# Patient Record
Sex: Male | Born: 1985 | Race: White | Hispanic: No | Marital: Single | Smoking: Current every day smoker
Health system: Southern US, Community
[De-identification: ages and names within clinical notes are randomized; demographics above are authoritative.]

## PROBLEM LIST (undated history)

## (undated) ENCOUNTER — Inpatient Hospital Stay (INDEPENDENT_AMBULATORY_CARE_PROVIDER_SITE_OTHER): Payer: Self-pay | Admitting: Surgery

## (undated) DIAGNOSIS — F1990 Other psychoactive substance use, unspecified, uncomplicated: Secondary | ICD-10-CM

## (undated) DIAGNOSIS — Z789 Other specified health status: Secondary | ICD-10-CM

## (undated) HISTORY — PX: NO PAST SURGERIES: SHX2092

---
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
## 2004-02-21 ENCOUNTER — Emergency Department (HOSPITAL_COMMUNITY): Admission: EM | Admit: 2004-02-21 | Discharge: 2004-02-21 | Payer: Self-pay | Admitting: Emergency Medicine

## 2004-03-04 ENCOUNTER — Inpatient Hospital Stay (HOSPITAL_COMMUNITY): Admission: EM | Admit: 2004-03-04 | Discharge: 2004-03-07 | Payer: Self-pay | Admitting: Emergency Medicine

## 2004-08-13 ENCOUNTER — Emergency Department (HOSPITAL_COMMUNITY): Admission: EM | Admit: 2004-08-13 | Discharge: 2004-08-13 | Payer: Self-pay | Admitting: Emergency Medicine

## 2004-09-09 ENCOUNTER — Emergency Department (HOSPITAL_COMMUNITY): Admission: EM | Admit: 2004-09-09 | Discharge: 2004-09-09 | Payer: Self-pay | Admitting: Emergency Medicine

## 2005-03-01 ENCOUNTER — Emergency Department (HOSPITAL_COMMUNITY): Admission: EM | Admit: 2005-03-01 | Discharge: 2005-03-01 | Payer: Self-pay | Admitting: Emergency Medicine

## 2006-06-01 ENCOUNTER — Emergency Department (HOSPITAL_COMMUNITY): Admission: EM | Admit: 2006-06-01 | Discharge: 2006-06-01 | Payer: Self-pay | Admitting: Emergency Medicine

## 2006-06-11 ENCOUNTER — Ambulatory Visit (HOSPITAL_BASED_OUTPATIENT_CLINIC_OR_DEPARTMENT_OTHER): Admission: RE | Admit: 2006-06-11 | Discharge: 2006-06-11 | Payer: Self-pay | Admitting: Otolaryngology

## 2006-12-09 ENCOUNTER — Emergency Department (HOSPITAL_COMMUNITY): Admission: EM | Admit: 2006-12-09 | Discharge: 2006-12-09 | Payer: Self-pay | Admitting: Emergency Medicine
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
## 2008-06-01 ENCOUNTER — Emergency Department (HOSPITAL_COMMUNITY): Admission: EM | Admit: 2008-06-01 | Discharge: 2008-06-01 | Payer: Self-pay | Admitting: Emergency Medicine

## 2008-12-22 ENCOUNTER — Emergency Department (HOSPITAL_COMMUNITY): Admission: EM | Admit: 2008-12-22 | Discharge: 2008-12-22 | Payer: Self-pay | Admitting: Emergency Medicine

## 2009-09-22 ENCOUNTER — Emergency Department (HOSPITAL_BASED_OUTPATIENT_CLINIC_OR_DEPARTMENT_OTHER): Admission: EM | Admit: 2009-09-22 | Discharge: 2009-09-22 | Payer: Self-pay | Admitting: Emergency Medicine

## 2009-11-20 ENCOUNTER — Emergency Department (HOSPITAL_COMMUNITY): Admission: EM | Admit: 2009-11-20 | Discharge: 2009-11-20 | Payer: Self-pay | Admitting: Emergency Medicine

## 2009-12-26 ENCOUNTER — Emergency Department (HOSPITAL_COMMUNITY): Admission: EM | Admit: 2009-12-26 | Discharge: 2009-12-26 | Payer: Self-pay | Admitting: Emergency Medicine

## 2010-01-05 ENCOUNTER — Emergency Department (HOSPITAL_COMMUNITY): Admission: EM | Admit: 2010-01-05 | Discharge: 2010-01-06 | Payer: Self-pay | Admitting: Emergency Medicine

## 2010-01-08 ENCOUNTER — Emergency Department (HOSPITAL_COMMUNITY): Admission: EM | Admit: 2010-01-08 | Discharge: 2010-01-08 | Payer: Self-pay | Admitting: Emergency Medicine

## 2010-05-02 LAB — URINALYSIS, ROUTINE W REFLEX MICROSCOPIC
Glucose, UA: NEGATIVE mg/dL
Hgb urine dipstick: NEGATIVE
Ketones, ur: NEGATIVE mg/dL
Nitrite: NEGATIVE
Protein, ur: NEGATIVE mg/dL
Specific Gravity, Urine: 1.026 (ref 1.005–1.030)
pH: 6 (ref 5.0–8.0)

## 2010-05-02 LAB — DIFFERENTIAL
Basophils Absolute: 0.1 10*3/uL (ref 0.0–0.1)
Basophils Relative: 1 % (ref 0–1)
Lymphocytes Relative: 27 % (ref 12–46)
Monocytes Absolute: 0.4 10*3/uL (ref 0.1–1.0)
Monocytes Relative: 5 % (ref 3–12)
Neutrophils Relative %: 64 % (ref 43–77)

## 2010-05-02 LAB — BASIC METABOLIC PANEL
BUN: 12 mg/dL (ref 6–23)
CO2: 27 mEq/L (ref 19–32)
Chloride: 107 mEq/L (ref 96–112)
GFR calc Af Amer: >60 mL/min (ref >60)
GFR calc non Af Amer: >60 mL/min (ref >60)
Potassium: 3.9 mEq/L (ref 3.5–5.1)

## 2010-05-02 LAB — POCT TOXICOLOGY PANEL
Cocaine: POSITIVE
Tetrahydrocannabinol: POSITIVE

## 2010-05-02 LAB — CBC
MCV: 99.6 fL (ref 78.0–100.0)
RBC: 4.82 MIL/uL (ref 4.22–5.81)

## 2010-05-02 LAB — URINE MICROSCOPIC-ADD ON

## 2010-05-02 LAB — ETHANOL: Alcohol, Ethyl (B): <5 mg/dL (ref 0–10)

## 2010-07-04 NOTE — Group Therapy Note (Signed)
NAMEKAIDYN, JAVID              ACCOUNT NO.:  [id number]   MEDICAL RECORD NO.:  [id number]          PATIENT TYPE:  INP   LOCATION:  IC10                          FACILITY:  [facility]   PHYSICIAN:  Edward L. Juanetta Gosling, M.D.DATE OF BIRTH:  [birth year]   DATE OF PROCEDURE:  03/05/2004  DATE OF DISCHARGE:                                   PROGRESS NOTE   SUBJECTIVE:  Mr. Luberto seems to be doing better this morning.  He is a  little more alert.  He is on Diprivan still.  His white count this morning  is 21,000, which is down from 25,000 yesterday.  His chest x-ray actually  looks a bit better to me.  He still has bilateral infiltrates, but it does  look better.  BMET shows normal.  Blood gas shows on a rate of 8:  PO2 is  185, pCO2 41, pH 7.43.   ASSESSMENT:  I think since his infiltrate looks to me a bit better, his  white count is down some.  I do believe he has probably aspirated but  overall, things seem a little bit better, so I think we should go ahead and  work on weaning today.     Edwa   ELH/MEDQ  D:  03/05/2004  T:  03/05/2004  Job:  [id number]

## 2010-07-04 NOTE — Group Therapy Note (Signed)
NAMEBUFFORD, HELMS              ACCOUNT NO.:  [id number]   MEDICAL RECORD NO.:  [id number]          PATIENT TYPE:  INP   LOCATION:  IC10                          FACILITY:  [facility]   PHYSICIAN:  Edward L. Juanetta Gosling, M.D.DATE OF BIRTH:  05/08/1985   DATE OF PROCEDURE:  DATE OF DISCHARGE:                                   PROGRESS NOTE   This is a patient of Dr. Blair Dolphin in the intensive care unit.   PROBLEM:  Status post drug overdose with respiratory failure from that and  probably aspiration pneumonia.   SUBJECTIVE:  Mr. Russell Chan is doing much better.  He has no new complaints.  His exam shows that he is awake and alert.  Blood gas shows pH of 7.41, pCO2  of 37, pO2 of 81.  This on 2 L.  His CBC shows white count 17,300,  hemoglobin is 12.9.  BMET shows sodium 133.  The rest of his electrolytes  are normal.   ASSESSMENT:  He seems to be doing well.  He was extubated yesterday.  He has  tolerated that very well.   PLAN:  Plan is that I am going to plan to sign off at this point.  Thank you  for allowing me to see him with you.     Edwa   ELH/MEDQ  D:  03/06/2004  T:  03/06/2004  Job:  [id number]

## 2010-07-04 NOTE — Group Therapy Note (Signed)
Russell Chan, Russell Chan              ACCOUNT NO.:  [id number]   MEDICAL RECORD NO.:  [id number]          PATIENT TYPE:  INP   LOCATION:  A207                          FACILITY:  [facility]   PHYSICIAN:  Vania Rea, M.D. DATE OF BIRTH:  19-Apr-1985   DATE OF PROCEDURE:  DATE OF DISCHARGE:                                   PROGRESS NOTE   PROGRESS NOTE:  Hospital day number three.   SUBJECTIVE:  Patient is alert and oriented.  expresses thanks that his life  has been saved. He denies any suicidal ideation; however, in consulting we  learned patient was recently committed to [facility] earlier this month  because of suicide attempts.  This was his third overdose and apparent  suicide attempt.  Apparently in the habit of drugging.   OBJECTIVE:  VITAL SIGNS:  Temperature 97, pulse 101, respirations 20, blood  pressure 111/63. He is saturating at 91% on room air.  CHEST:  He has diffuse rhonchi and wheezes.  CARDIOVASCULAR:  Regular rate and rhythm.  ABDOMEN:  Soft and nontender.  EXTREMITIES:  Without edema.   LABORATORY DATA:  His ABG indicates continued hypoxia for his age on two  liters.  His pO2 is only 81.  His white count has fallen to 17,000.  His  serum chemistry is unremarkable.   ASSESSMENT AND PLAN:  1.  Polysubstance abuse, drug overdose, history of recurrent overdoses.  We      will continue him on suicide precautions.  2.  Aspiration pneumonia, resolving.  bronchitis; We will continue Levaquin      and clindamycin; start nebs. continue oxygen.  3.  Tobacco abuse.  We will continue the nicotine patch.   The patient will not be eligible for transfer to a psych facility until he  is taken off oxygen, so he may be here for a few more days.  When his oxygen  requirements return to normal, patient can probably be discharged to a psych  facility on oral medications.     Leop   LC/MEDQ  D:  03/06/2004  T:  03/06/2004  Job:  [id number]
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
## 2010-07-04 NOTE — Procedures (Signed)
NAMELENARDO, WESTWOOD              ACCOUNT NO.:  [id number]   MEDICAL RECORD NO.:  [id number]          PATIENT TYPE:  INP   LOCATION:  IC10                          FACILITY:  [facility]   PHYSICIAN:  Dani Gobble, MD       DATE OF BIRTH:  05-08-1985   DATE OF PROCEDURE:  03/05/2004  DATE OF DISCHARGE:                                  ECHOCARDIOGRAM   REFERRING PHYSICIAN:  Dr. Orvan Falconer   INDICATIONS:  Mr. Perotti is a 25 year old who was admitted with an  overdose, referred for evaluation of LV function.   The technical quality of the study is adequate.   The aorta is within normal limits at 3.4 cm.   The left atrium is also within normal limits at 3.4 cm.  No obvious clots or  masses were appreciated and the patient appeared to be in mild sinus  tachycardia during the procedure.   The intraventricular septum and posterior wall are within normal limits in  thickness at approximately 1.0 cm.   The aortic valve appeared reasonably structurally normal with normal leaflet  excursion.  No significant aortic insufficiency is noted.  Doppler  interrogation of the aortic valve is within normal limits.   The mitral valve also appeared structurally normal.  Trivial and probably  physiologic mitral regurgitation is noted.  Doppler interrogation of the  mitral valve is within normal limits.   The pulmonic valve is incompletely visualized.   The tricuspid valve also appears structurally normal with mild tricuspid  regurgitation noted.   The left ventricle is normal in size with the LVIDD measured at 4.9 cm and  the LVISD measured at 3.8 cm.  Overall left ventricular systolic function  appears to be low normal to borderline mildly depressed.  Estimated ejection  fraction is approximately 50%.  At times there appears to be mild global  hypokinesis and at other times there appears to be some regionality to it  with anterior septal wall hypokinesis slightly more so than the remaining   walls.   The right ventricle appears to be at the upper limits of normal in size  with, again, low normal right ventricular systolic function to mildly  depressed.   IMPRESSION:  1.  Trivial and probably physiologic mitral regurgitation.  2.  Mild tricuspid regurgitation.  3.  Normal left ventricular chamber size with low normal ejection fraction      to borderline mildly depressed left ventricular ejection fraction      estimated at 50%.  At times there appears to be mild global hypokinesis      and at other times there appears to be some regionality to this with      anterior septal wall being somewhat more hypokinetic than the remaining      walls.  4.  Low normal right ventricular systolic function to mildly depressed right      ventricular systolic function.  5.  Consider repeat echocardiogram in three months.      AB/MEDQ  D:  03/05/2004  T:  03/05/2004  Job:  [id number]

## 2010-07-04 NOTE — Group Therapy Note (Signed)
[name], [name]              ACCOUNT NO.:  [id number]   MEDICAL RECORD NO.:  [id number]          PATIENT TYPE:  INP   LOCATION:  IC10                          FACILITY:  [facility]   PHYSICIAN:  Vania Rea, M.D. DATE OF BIRTH:  1986/02/06   DATE OF PROCEDURE:  DATE OF DISCHARGE:                                   PROGRESS NOTE   Hospital day number two.   SUBJECTIVE:  The patient said he is having no problem.  He became very  agitated last night on the ventilator and had to be placed on Diprivan and  his Ativan weaned off.  Because of his agitation last night, he was advanced  to weaning protocol this morning and did very well and came off the vent at  about midday.  Since then he has been talking.  He has been started on a  clear liquid diet.  He has no memory of what happened yesterday.   OBJECTIVE:  VITAL SIGNS:  His blood pressure is 121/60.  His temperature had  a T max of 103.  Pulses are 106 now that he is off the vent.  His  respirations are 27.  CHEST:  Actually clear to auscultation bilaterally.  CARDIOVASCULAR:  Tachycardic.  ABDOMEN:  Soft and nontender.  EXTREMITIES:  Without edema.  He does not appear to be dehydrated.   LABORATORY:  His white count has decreased to 21.5.  It is otherwise  unremarkable.  His serum chemistry is entirely normal.  His total CK drawn  yesterday evening is 1539, but his CK-MB was only 6.5.  His liver function  tests were normal, although his albumin was somewhat low for his age at 2.9.  His calcium was 8.3.  Blood cultures are so far negative.  His beta type  natriuretic peptide was only 82.  His ABG done when he went on FiO2 of 40%  on pressure support of 10 showed a pH of 7.4, pCO2 of 4, a pO2 of 83,  saturating at 96.7%.  Currently saturating at 95% on 40% mask.  His chest x-  ray shows some improvement in the opacities.   ASSESSMENT:  1.  Altered mental status, probably related to drug overdose, improved. will      continue  supportive care, O2 supplementation and antibiotics;  2.  Hypoxic respiratory failure, improving.  3.  Probable aspiration pneumonia.  We will continue Levaquin and      clindamycin.  Tracheal and blood cultures are pending.  4.  Polysubstance abuse.  We will give a nicotine patch.  When this patient      is clinically more stable, probably tomorrow, we will get an ACT consult      for consideration for transfer to a psychiatric facility.  We will      continue one to one nursing.     Leop  LC/MEDQ  D:  03/05/2004  T:  03/05/2004  Job:  [id number]

## 2010-07-04 NOTE — Consult Note (Signed)
NAMESUEDE, GREENAWALT              ACCOUNT NO.:  [id number]   MEDICAL RECORD NO.:  [id number]          PATIENT TYPE:  INP   LOCATION:  IC10                          FACILITY:  [facility]   PHYSICIAN:  Edward L. Juanetta Gosling, M.D.DATE OF BIRTH:  [birth year]   DATE OF CONSULTATION:  DATE OF DISCHARGE:                                   CONSULTATION   CONSULTING PHYSICIAN:  Edward L. Juanetta Gosling, M.D.   REFERRING PHYSICIAN:  Vania Rea, M.D.   REASON FOR CONSULTATION:  Respiratory failure, status post a drug overdose.   HISTORY:  Mr. Baetz is a 25 year old who had apparently fallen asleep  about 7 yesterday evening and when his friends went to wake him up, he was  not arousable.  He had some sort of yellowish fluid from his nostrils.  Called EMS who came and saw him.  He was unconscious.  He had a Glasgow Coma  Score of 3.  Pulse ox was 94%.  Blood glucose 207.  He was intubated there.  He did receive Narcan.  He was intubated, became aroused, extubated himself.  He was re intubated.  His girlfriend apparently has to take methadone and  noted that some of her methadone disks were missing, and she thinks that he  may have taken them but is not clear.  He has been in the intensive care  unit since then.  He has had a number of tests done.   PAST MEDICAL HISTORY:  Pretty much unknown, except he apparently has some  allergies and takes Claritin occasionally.   According to his girlfriend, he smokes a package of cigarettes daily.  He  drinks 6-12 beers every other day.  He does use marijuana and not known if  he is on any other medications or not.   FAMILY HISTORY:  Unknown, except that apparently his mother is in a halfway  house.   PHYSICAL EXAMINATION:  GENERAL:  Shows that he is intubated, sedated,  unresponsive.  VITAL SIGNS:  His pulse is 140, blood pressure 98/64, O2 sat is 99% on  current settings.  HEENT:  Shows his pupils are small.  I am not sure if they are reacting or  not.   Mucous membranes are moist.  Nose and throat are clear.  CHEST:  Fairly clear.  HEART:  Rapid without gallop.  ABDOMEN:  Soft.  No masses are felt.  EXTREMITIES:  Showed no edema.  RECTAL:  He had some sort of lesion on his buttocks.   Arterial blood gas, on 100% assist control of 12, 550 tidal volume, shows a  pH of 7.13, pCO2 82, pO2 of 259, O2 sat 99%.  On 50% pH is 7.35, pCO2 of 50,  pO2 of 44.  White count is 25,000, hemoglobin 15.7, platelets 337, 90%  neutrophils, increased bands.  Sodium is 133, calcium 8.4. Urine drug screen  is positive for benzodiazepines, THC.  Acetaminophen and salicylate levels  are undetectable.  Alcohol undetectable.   Chest x-ray shows bilateral infiltrates.   ASSESSMENT:  1.  He has probably had an acute drug overdose.  2.  He has  bilateral infiltrates suggestive of acute respiratory distress      syndrome versus aspiration which of course is another possibility.  3.  Based on his exam, he appears to be dehydrated.  4.  He was down for an unknown period of time, so he may well have some      hypoxic brain damage as well.  I do not think we have a good answer at      this point.   He has blood cultures pending.  Last blood gas looks better on 70% O2.  Because of the elevated white blood cell count, the possibility that he has  a aspiration, I think we should go ahead and treat him with an antibiotic.  Dr. Orvan Falconer has already ordered clindamycin which is perfectly appropriate.  We will recheck labs, etcetera in the morning.  I do not think there is a  great deal else to add.     Edwa   ELH/MEDQ  D:  03/04/2004  T:  03/04/2004  Job:  [id number]

## 2010-07-04 NOTE — Discharge Summary (Signed)
Russell Chan, Russell Chan              ACCOUNT NO.:  [id number]   MEDICAL RECORD NO.:  [id number]          PATIENT TYPE:  INP   LOCATION:  A207                          FACILITY:  [facility]   PHYSICIAN:  Vania Rea, M.D. DATE OF BIRTH:  02-Dec-1985   DATE OF ADMISSION:  03/04/2004  DATE OF DISCHARGE:  01/19/2006LH                                 DISCHARGE SUMMARY   PRIMARY CARE PHYSICIAN:  Unassigned.   DISCHARGE DIAGNOSES:  1.  Altered mental status and coma due to drug overdose.  2.  Acute respiratory failure, resolved.  3.  Aspiration pneumonia, much improved.  4.  Bronchitis, much improved.  5.  Tobacco abuse, ongoing.  6.  A history of alcohol abuse.   DISPOSITION:  Transfer to [facility].   DISCHARGE CONDITION:  Stable.   DISCHARGE MEDICATIONS:  1.  Combivent inhaler 2 puffs 4 times daily.  2.  Clindamycin 300 mg 3 times daily.  3.  Folic acid 1 mg daily.  4.  Levaquin 750 mg daily for 5 days.  5.  Thiamine 100 mg daily.   HOSPITAL COURSE:  Please refer to the admission history and physical of  January 17th.  This is a 25 year old Caucasian man who was brought in  unconscious with a history of being found unresponsive on his girlfriend's  couch at about 8:30 or 9:00 in the morning.  The girlfriend gave the history  that he had fallen asleep on the couch from 7:00 the night before.  She also  noted that about 25 of her methadone 40-mg disks were missing.  The patient  was given Narcan by the EMS and became combative, extubated himself in the  ambulance, came to the emergency room and was reintubated but was found to  be severely hypoxic with bilateral lung infiltrates.  The patient was also  febrile and had a white count of 25,000.  The patient was kept on the  ventilator overnight, given clindamycin and Levaquin in case of aspiration  and had the benefit of a pulmonary consult.  She did well, and we will able  to extubate the following day.  He,  however, remains severely hypoxic, and  his white count is 17,000 up until yesterday.  His breathing, however, has  progressively improved, and today he is saturating well on room air.  His  white count has fallen closer to the normal range, and he is agitated to be  discharged.  Despite the fact that this is his third overdose in a month,  and he was recently admitted to [facility] in early January, he insists  that he is not suicidal.  However, we consider this young man somewhat  irresponsible and is definitely at risk of taking his own life, and we  strongly recommend committal.  This morning he is alert and oriented.  His  only complaint is that he would like his nicotine patch discontinued so that  he can smoke.   OBJECTIVE:  VITAL SIGNS:  Temperature 98, pulse 93, respirations 18, blood  pressure 99/54.  He is saturating at 91% on room air.  CHEST:  Clear to auscultation bilaterally.  CARDIOVASCULAR SYSTEM:  Regular rhythm.  ABDOMEN:  Soft and nontender.  EXTREMITIES:  Without edema.   LABORATORY:  His white count is 11.9.  His hemoglobin is 13.9, platelets  210.  His sodium is 134, potassium 3.4, chloride 100, CO2 25, glucose 88,  BUN 7, creatinine 0.8, calcium 8.7.  His urine drug screen was positive for  THC  and benzodiazepine.  Negative for cocaine and opiates.   FOLLOWUP:  The patient is to be followed up by the attending physician at  [facility].     Leop   LC/MEDQ  D:  03/07/2004  T:  03/07/2004  Job:  [id number]

## 2010-07-04 NOTE — H&P (Signed)
Russell Chan, Russell Chan              ACCOUNT NO.:  [id number]   MEDICAL RECORD NO.:  [id number]          PATIENT TYPE:  EMS   LOCATION:  ED                            FACILITY:  [facility]   PHYSICIAN:  Vania Rea, M.D. DATE OF BIRTH:  1986-02-08   DATE OF ADMISSION:  03/04/2004  DATE OF DISCHARGE:  LH                                HISTORY & PHYSICAL   PRIMARY CARE PHYSICIAN:  Unassigned.   CHIEF COMPLAINT:  Found unconscious, a possible drug overdose.   HISTORY OF PRESENT ILLNESS:  This is a 25 year old Caucasian man, brought in  by EMS.  The history is from the emergency room physician and the patient's  girlfriend.  The patient apparently was left asleep on his girlfriend's  couch from 7 p.m. yesterday evening.  She tried to arouse him at about 8:30  or 9 a.m. this morning, and he was not arousable.  Then she noticed yellow  fluid draining from his nostrils.  She called the [facility], who from their records and the emergency room physician's report  found him to be unconscious with a Glasgow coma scale of 3.  His pulse  oximetry was 94%. (apparently on 15l O2)  His blood glucose was 207.  He was  intubated.  He received naloxone 2 mg.   His blood pressure was 154/palpable.  His heart rate was 132.  His  respirations were 8.  His Glasgow coma score was 3.  He was completely  unconscious and his pupils were dilated.  Apparently he became arousable in  response to the naloxone, because he extubated himself, but in the emergency  room he was re-intubated.  His girlfriend notes that 98 of her methadone  40mg  discs were missing, and she feels that he may have taken them, but she  is not sure.  In the emergency room the patient was intubated, but because  of agitation, had to be sedated and received 5 mg of Ativan.  He did not  receive any further naloxone, but did continue to vomit after intubation,  and an orogastric tube was passed.   Initial ABG after intubation  revealed that the patient was hypoxic with a PF  ratio of 250, and severely hypercarbic with a pCO2 of 80.  After adjustments  were made on  the vent, his saturation dropped significantly.  His O2 has  been increased.  He has had chest x-rays, urine drug screens and other blood  work.   His girlfriend knows of no medical illnesses she does note that he  occasionally takes Claritin for nasal allergies.   PAST MEDICAL HISTORY:  Unable to ascertain.   MEDICATIONS:  Unable to ascertain, but Claritin occasionally.   ALLERGIES:  No known drug allergies.   SOCIAL HISTORY:  According to his girlfriend, he smokes one pack of  cigarettes per day. He drinks six to 12 beers every other day.  He smokes  marijuana.  She is unsure of any other illicit drug use.  He has been  drinking alcohol at this level for about two to three years now.  His mother  is apparently in a half-way house.   FAMILY HISTORY:  We were unable to obtain.   REVIEW OF SYSTEMS:  Unable to obtain.  His girlfriend says he had no  complaints that she knows of.   PHYSICAL EXAMINATION:  GENERAL:  A young Caucasian man, lying on the  stretcher, intubated and completely unresponsive.  VITAL SIGNS:  The vent settings currently are FIO2 of 7%, rate of 16, volume  550.  His vital signs of this setting reveal a pulse of 114, respirations  29, blood pressure 110/65, saturation 96%.  HEENT:  His pupils are about 2 mm, equal, with no obvious reaction to light.  His mucous membranes are moist.  CHEST:  Clear to auscultation bilaterally.  CARDIOVASCULAR:  Tachycardic, no murmurs.  ABDOMEN:  Scaphoid, no masses.  EXTREMITIES:  Without edema.  His feet are warm.   LABORATORY DATA:  His initial ABG on 100% with an AC of 12 x550:  A pH of  7.13, pCO2 of 82, pO2 of 259, saturation 99%.  His last ABG on 50% with an  AC of 16 x550:  A pH of 7.35, pCO2 of 50.8, pO2 of 44, saturation 81%.  CBC:  His white count is 25.7, hemoglobin 15.7,  platelets 337.  He has 90%  neutrophils and he has increased bands on a smear.  Sodium 133, potassium  5.2, chloride 94, CO2 of 25, glucose 132, BUN 8, creatinine 1.3, calcium  8.4.  Acetaminophen and salicylate levels are undetectable.  His urine drug  screen in positive for benzodiazepines and THC.  No cocaine or opiates  detected.  Of course, methadone does not show up as opiates in the urine.  His alcohol level was undetectable.  The computer does record that he was  here on February 22, 2004, of an alcohol level of 115.  His urinalysis shows  him to have a specific gravity greater than 1030, protein is 100, white  count is 11-20, red cells 7-10, a few bacteria and amorphous phosphate.   His chest x-ray shows adequate placement of his ET tube, but also bilateral  infiltrates.   ASSESSMENT:  1.  Probable acute unconsciousness due to acute drug overdose.  2.  Adult respiratory distress syndrome by chest x-ray and blood work;      likely due to aspiration or drug induced.  3.  Acute systemic inflammatory response syndrome by white count and      tachycardia.  4.  Dehydration by clinical exam, chemistry and urinalysis.   PLAN:  We will hydrate him.  We will adjust the vent settings to get his CO2  closer to 40, and his pO2 closer to 100.  Will monitor his  electrocardiogram.  Will give Narcan p.r.n.  We will reassess him for  weaning in the morning. Will cover with abx for aspiration; other management  as per orders.     Leop   LC/MEDQ  D:  03/04/2004  T:  03/04/2004  Job:  [id number]

## 2010-08-31 ENCOUNTER — Encounter (INDEPENDENT_AMBULATORY_CARE_PROVIDER_SITE_OTHER): Payer: Self-pay | Admitting: Surgery

## 2010-08-31 NOTE — Discharge Summary (Signed)
Per the 1991 NIH Consensus Statement, the patient is a candidate for bariatric surgery.  The patient attended our initial information session and reviewed the types of bariatric surgery.  The patient is interested in the Roux en Y Gastric Bypass.  I discussed with the patient the indications and risks of bariatric surgery.  The potential risks of surgery include, but are not limited to, bleeding, infection, leak from the bowel, DVT and PE, long term nutrition consequences, and death.   Not sure why this is showing up in December 2014 in "My Incomplete Notes" file.  Strange Epic presentation.

## 2010-08-31 NOTE — Discharge Summary (Signed)
3 month followup with me.  Labs to be obtained by your medical doctor about 1 week prior to your next visit with me:  CDC, CMET, Lipid panel, HGBA1C, Folate, Fe, IBC, B12, Thiamine, Mg++

## 2010-08-31 NOTE — H&P (Signed)
Russell Chan is a 25 y.o. white male who is a patient of No primary provider on file. and comes to me today for bariatric surgery.  No past medical history on file. No past surgical history on file. Current Outpatient Prescriptions  Medication Sig Dispense Refill  . cephALEXin (KEFLEX) 250 MG capsule Take 250 mg by mouth 4 (four) times daily.        . famotidine (PEPCID) 20 MG tablet Take 20 mg by mouth 2 (two) times daily.        . ranitidine (ZANTAC) 150 MG tablet Take 150 mg by mouth 2 (two) times daily.         Allergies not on file  SOCIAL HISTORY:  PHYSICAL EXAM:  ASSESSMENT:  PLAN:   He needs emergency bariatric surgery.  Per the 1991 NIH Consensus Statement, the patient is a candidate for bariatric surgery.  The patient attended our initial information session and reviewed the types of bariatric surgery.  The patient is interested in the Roux en Y Gastric Bypass.  I discussed with the patient the indications and risks of bariatric surgery.  The potential risks of surgery include, but are not limited to, bleeding, infection, leak from the bowel, DVT and PE, long term nutrition consequences, and death.  The patient understands the importance of compliance and long term follow-up with our group after surgery.  Candis Shine .dhn

## 2010-12-19 ENCOUNTER — Inpatient Hospital Stay (INDEPENDENT_AMBULATORY_CARE_PROVIDER_SITE_OTHER)
Admission: RE | Admit: 2010-12-19 | Discharge: 2010-12-19 | Disposition: A | Discharge status: Home / Self Care | Payer: Self-pay | Source: Ambulatory Visit | Attending: Emergency Medicine | Admitting: Emergency Medicine

## 2010-12-19 DIAGNOSIS — K047 Periapical abscess without sinus: Secondary | ICD-10-CM

## 2010-12-23 ENCOUNTER — Inpatient Hospital Stay (HOSPITAL_COMMUNITY)
Admission: EM | Admit: 2010-12-23 | Discharge: 2010-12-25 | DRG: 159 | Disposition: A | Discharge status: Home / Self Care | Payer: Self-pay | Source: Ambulatory Visit | Attending: Oral Surgery | Admitting: Oral Surgery

## 2010-12-23 ENCOUNTER — Emergency Department (HOSPITAL_COMMUNITY): Payer: Self-pay | Admitting: Anesthesiology

## 2010-12-23 ENCOUNTER — Emergency Department (HOSPITAL_COMMUNITY): Payer: Self-pay

## 2010-12-23 ENCOUNTER — Encounter: Payer: Self-pay | Admitting: *Deleted

## 2010-12-23 ENCOUNTER — Encounter (HOSPITAL_COMMUNITY)
Admission: EM | Disposition: A | Discharge status: Home / Self Care | Payer: Self-pay | Source: Ambulatory Visit | Attending: Oral Surgery

## 2010-12-23 ENCOUNTER — Encounter (HOSPITAL_COMMUNITY): Payer: Self-pay | Admitting: Anesthesiology

## 2010-12-23 DIAGNOSIS — Z91018 Allergy to other foods: Secondary | ICD-10-CM

## 2010-12-23 DIAGNOSIS — K047 Periapical abscess without sinus: Principal | ICD-10-CM | POA: Diagnosis present

## 2010-12-23 DIAGNOSIS — L03211 Cellulitis of face: Secondary | ICD-10-CM

## 2010-12-23 DIAGNOSIS — Z880 Allergy status to penicillin: Secondary | ICD-10-CM

## 2010-12-23 DIAGNOSIS — F17200 Nicotine dependence, unspecified, uncomplicated: Secondary | ICD-10-CM | POA: Diagnosis present

## 2010-12-23 DIAGNOSIS — K029 Dental caries, unspecified: Secondary | ICD-10-CM | POA: Diagnosis present

## 2010-12-23 DIAGNOSIS — Z91038 Other insect allergy status: Secondary | ICD-10-CM

## 2010-12-23 HISTORY — PX: IRRIGATION AND DEBRIDEMENT ABSCESS: SHX5252

## 2010-12-23 HISTORY — DX: Other specified health status: Z78.9

## 2010-12-23 HISTORY — PX: TOOTH EXTRACTION: SHX859

## 2010-12-23 LAB — CBC
HCT: 41 % (ref 39.0–52.0)
Hemoglobin: 14.7 g/dL (ref 13.0–17.0)
MCH: 34.9 pg — ABNORMAL HIGH (ref 26.0–34.0)
WBC: 22.8 10*3/uL — ABNORMAL HIGH (ref 4.0–10.5)

## 2010-12-23 LAB — BASIC METABOLIC PANEL
CO2: 22 mEq/L (ref 19–32)
Calcium: 8.9 mg/dL (ref 8.4–10.5)
GFR calc non Af Amer: >90 mL/min (ref >90)

## 2010-12-23 SURGERY — DENTAL RESTORATION/EXTRACTIONS
Anesthesia: General | Site: Neck

## 2010-12-23 MED ORDER — IOHEXOL 300 MG/ML  SOLN: 80.0000 mL | Freq: Once | INTRAMUSCULAR | Status: AC | PRN

## 2010-12-23 MED ORDER — SODIUM CHLORIDE 0.9 % IV SOLN
INTRAVENOUS | Status: DC
  Administered 2010-12-23 – 2010-12-24 (×2): via INTRAVENOUS

## 2010-12-23 MED ORDER — CLINDAMYCIN PHOSPHATE 600 MG/50ML IV SOLN
600.0000 mg | Freq: Once | INTRAVENOUS | Status: AC
  Administered 2010-12-23: 600 mg via INTRAVENOUS
  Filled 2010-12-23: qty 50

## 2010-12-23 MED ORDER — SUCCINYLCHOLINE CHLORIDE 20 MG/ML IJ SOLN
INTRAMUSCULAR | Status: DC | PRN
  Administered 2010-12-23: 100 mg via INTRAVENOUS

## 2010-12-23 MED ORDER — KETAMINE HCL 10 MG/ML IJ SOLN
INTRAMUSCULAR | Status: DC | PRN
  Administered 2010-12-23 (×2): 10 mg via INTRAVENOUS

## 2010-12-23 MED ORDER — MIDAZOLAM HCL 5 MG/5ML IJ SOLN
INTRAMUSCULAR | Status: DC | PRN
  Administered 2010-12-23: 2 mg via INTRAVENOUS

## 2010-12-23 MED ORDER — ACETAMINOPHEN 650 MG RE SUPP: 650.0000 mg | RECTAL | Status: DC | PRN

## 2010-12-23 MED ORDER — ONDANSETRON HCL 4 MG/2ML IJ SOLN
INTRAMUSCULAR | Status: DC | PRN
  Administered 2010-12-23: 4 mg via INTRAVENOUS

## 2010-12-23 MED ORDER — ACETAMINOPHEN 325 MG PO TABS: 325.0000 mg | ORAL_TABLET | ORAL | Status: DC | PRN

## 2010-12-23 MED ORDER — PROMETHAZINE HCL 25 MG/ML IJ SOLN: 6.2500 mg | INTRAMUSCULAR | Status: DC | PRN

## 2010-12-23 MED ORDER — MEPERIDINE HCL 25 MG/ML IJ SOLN: 6.2500 mg | INTRAMUSCULAR | Status: DC | PRN

## 2010-12-23 MED ORDER — CLINDAMYCIN PHOSPHATE 600 MG/50ML IV SOLN: 600.0000 mg | Freq: Three times a day (TID) | INTRAVENOUS | Status: DC

## 2010-12-23 MED ORDER — HYDROMORPHONE HCL PF 1 MG/ML IJ SOLN
1.0000 mg | Freq: Once | INTRAMUSCULAR | Status: AC
  Administered 2010-12-23: 1 mg via INTRAVENOUS
  Filled 2010-12-23: qty 1

## 2010-12-23 MED ORDER — ACETAMINOPHEN 650 MG RE SUPP: 650.0000 mg | Freq: Once | RECTAL | Status: DC

## 2010-12-23 MED ORDER — SODIUM CHLORIDE 0.9 % IV SOLN
Freq: Once | INTRAVENOUS | Status: AC
  Administered 2010-12-23: 13:00:00 via INTRAVENOUS

## 2010-12-23 MED ORDER — HYDROCODONE-ACETAMINOPHEN 5-325 MG PO TABS
1.0000 | ORAL_TABLET | ORAL | Status: DC | PRN
  Administered 2010-12-23 – 2010-12-25 (×7): 1 via ORAL
  Filled 2010-12-23 (×7): qty 1

## 2010-12-23 MED ORDER — CLINDAMYCIN PHOSPHATE 600 MG/50ML IV SOLN
600.0000 mg | Freq: Three times a day (TID) | INTRAVENOUS | Status: DC
  Administered 2010-12-23 – 2010-12-25 (×5): 600 mg via INTRAVENOUS
  Filled 2010-12-23 (×7): qty 50

## 2010-12-23 MED ORDER — HYDROCODONE-ACETAMINOPHEN 5-500 MG PO TABS: 1.0000 | ORAL_TABLET | ORAL | Status: DC | PRN

## 2010-12-23 MED ORDER — ACETAMINOPHEN 650 MG RE SUPP
650.0000 mg | Freq: Once | RECTAL | Status: AC
  Administered 2010-12-23: 650 mg via RECTAL
  Filled 2010-12-23: qty 1

## 2010-12-23 MED ORDER — GLYCOPYRROLATE 0.2 MG/ML IJ SOLN
INTRAMUSCULAR | Status: DC | PRN
  Administered 2010-12-23: 0.2 mg via INTRAVENOUS

## 2010-12-23 MED ORDER — HYDROMORPHONE HCL PF 1 MG/ML IJ SOLN
0.2500 mg | INTRAMUSCULAR | Status: DC | PRN
  Administered 2010-12-23 (×3): 0.5 mg via INTRAVENOUS

## 2010-12-23 MED ORDER — PROPOFOL 10 MG/ML IV EMUL
INTRAVENOUS | Status: DC | PRN
  Administered 2010-12-23: 110 mg via INTRAVENOUS

## 2010-12-23 MED ORDER — SODIUM CHLORIDE 0.9 % IR SOLN
Status: DC | PRN
  Administered 2010-12-23: 1000 mL

## 2010-12-23 MED ORDER — FENTANYL CITRATE 0.05 MG/ML IJ SOLN
INTRAMUSCULAR | Status: DC | PRN
  Administered 2010-12-23: 100 ug via INTRAVENOUS

## 2010-12-23 MED ORDER — LACTATED RINGERS IV SOLN
INTRAVENOUS | Status: DC | PRN
  Administered 2010-12-23 (×2): via INTRAVENOUS

## 2010-12-23 SURGICAL SUPPLY — 37 items
ALCOHOL 70% 16 OZ (MISCELLANEOUS) IMPLANT
BLADE SURG 15 STRL LF DISP TIS (BLADE) ×2 IMPLANT
BLADE SURG 15 STRL SS (BLADE) ×1
BUR RND FLUTED 2.5 (BURR) IMPLANT
BUR STRYKR 2.5 FLUT MED (BURR) IMPLANT
BUR SURG 4X8 MED (BURR) IMPLANT
BURR SURG 4X8 MED (BURR)
CANISTER SUCTION 2500CC (MISCELLANEOUS) ×3 IMPLANT
CLOTH BEACON ORANGE TIMEOUT ST (SAFETY) ×3 IMPLANT
COVER SURGICAL LIGHT HANDLE (MISCELLANEOUS) ×3 IMPLANT
DRAIN PENROSE 1/4X12 LTX STRL (WOUND CARE) ×3 IMPLANT
GAUZE PACKING FOLDED 2  STR (GAUZE/BANDAGES/DRESSINGS) ×1
GAUZE PACKING FOLDED 2 STR (GAUZE/BANDAGES/DRESSINGS) ×2 IMPLANT
GAUZE SPONGE 4X4 16PLY XRAY LF (GAUZE/BANDAGES/DRESSINGS) ×3 IMPLANT
GLOVE BIO SURGEON STRL SZ 6.5 (GLOVE) ×3 IMPLANT
GLOVE BIO SURGEON STRL SZ7.5 (GLOVE) ×6 IMPLANT
GOWN PREVENTION PLUS XLARGE (GOWN DISPOSABLE) ×3 IMPLANT
GOWN STRL NON-REIN LRG LVL3 (GOWN DISPOSABLE) ×6 IMPLANT
GOWN W/COTTON TOWEL STD LRG (GOWNS) IMPLANT
KIT BASIN OR (CUSTOM PROCEDURE TRAY) ×3 IMPLANT
KIT ROOM TURNOVER OR (KITS) ×3 IMPLANT
NEEDLE BLUNT 16X1.5 OR ONLY (NEEDLE) ×6 IMPLANT
NEEDLE DENTAL 27 LONG (NEEDLE) IMPLANT
NS IRRIG 1000ML POUR BTL (IV SOLUTION) ×3 IMPLANT
PACK EENT II TURBAN DRAPE (CUSTOM PROCEDURE TRAY) ×3 IMPLANT
PAD ARMBOARD 7.5X6 YLW CONV (MISCELLANEOUS) ×3 IMPLANT
SPONGE GAUZE 4X4 12PLY (GAUZE/BANDAGES/DRESSINGS) IMPLANT
SPONGE GAUZE 4X4 FOR O.R. (GAUZE/BANDAGES/DRESSINGS) ×3 IMPLANT
SUT CHROMIC 3 0 PS 2 (SUTURE) IMPLANT
SUT ETHILON 4 0 P 3 18 (SUTURE) ×3 IMPLANT
SYR 50ML SLIP (SYRINGE) ×6 IMPLANT
TAPE CLOTH SURG 4X10 WHT LF (GAUZE/BANDAGES/DRESSINGS) ×3 IMPLANT
TUBE CONNECTING 12X1/4 (SUCTIONS) ×3 IMPLANT
TUBING IRRIGATION (MISCELLANEOUS) IMPLANT
VENT IRR SPI W TUB AD (MISCELLANEOUS) IMPLANT
WATER STERILE IRR 1000ML POUR (IV SOLUTION) ×3 IMPLANT
YANKAUER SUCT BULB TIP NO VENT (SUCTIONS) ×3 IMPLANT

## 2010-12-23 NOTE — Anesthesia Procedure Notes (Signed)
Procedures

## 2010-12-23 NOTE — Addendum Note (Signed)
Addendum  created 12/23/10 2038 by W. Edmond Zeshan Sena, MD   Modules edited:Anesthesia Blocks and Procedures, Anesthesia Flowsheet, Inpatient Notes    

## 2010-12-23 NOTE — ED Provider Notes (Signed)
History     CSN: [id number] Arrival date & time: 12/23/2010 10:59 AM   None     Chief Complaint  Patient presents with  . Facial Swelling  . Allergic Reaction    (Consider location/radiation/quality/duration/timing/severity/associated sxs/prior treatment) Patient is a 25 y.o. male presenting with abscess. The history is provided by the patient.  Abscess  This is a new problem. The current episode started less than one week ago. The onset was gradual. The abscess is present on the face. The problem is moderate. The abscess is characterized by painfulness, swelling and redness. Pertinent negatives include no fever and no sore throat.  Pt states he believes this started out as a dental abscess. States went to the urgent care where they gave him penicillin and vicodin. States since then it has been getting larger, more painful. Denies any other complaints.  History reviewed. No pertinent past medical history.  No past surgical history on file.  No family history on file.  History  Substance Use Topics  . Smoking status: Current Everyday Smoker  . Smokeless tobacco: Not on file  . Alcohol Use: Yes      Review of Systems  Constitutional: Positive for chills. Negative for fever.  HENT: Positive for facial swelling, neck pain, neck stiffness and dental problem. Negative for ear pain, sore throat and trouble swallowing.   Eyes: Negative.   Respiratory: Negative.   Cardiovascular: Negative.   Gastrointestinal: Negative.   Skin:       Abscess, swelling  Neurological: Negative.   Psychiatric/Behavioral: Negative.     Allergies  Bee; Onion; and Penicillins  Home Medications   Current Outpatient Rx  Name Route Sig Dispense Refill  . HYDROCODONE-ACETAMINOPHEN 5-500 MG PO TABS Oral Take 1 tablet by mouth every 6 (six) hours as needed. For pain     . PENICILLIN V POTASSIUM 500 MG PO TABS Oral Take 500 mg by mouth 4 (four) times daily.        BP 139/80  Pulse 105  Temp(Src)  99.8 F (37.7 C) (Oral)  SpO2 96%  Physical Exam  Nursing note and vitals reviewed. Constitutional: He is oriented to person, place, and time. He appears well-developed and well-nourished.       Uncomfortable appearing  HENT:  Head: Normocephalic.       Poor dentition. No obvious dental abscess noted. Large swelling, indurated, fluctulant, tender to palaption area to the left submandubular/neck region. Erythema extending up the cheek and down left neck.  Neck: Neck supple.  Cardiovascular: Normal rate, regular rhythm and normal heart sounds.   Pulmonary/Chest: Effort normal and breath sounds normal. No respiratory distress.  Musculoskeletal: Normal range of motion.  Neurological: He is alert and oriented to person, place, and time.  Skin: Skin is warm and dry.  Psychiatric: He has a normal mood and affect.    ED Course  Procedures (including critical care time)  Large abscess to the left submandibular area. Unsure of the exact source. Will do CT neck soft tissue to further evaluate  3:24 PM Spoke with oral surgeon, Dr. Hyacinth Meeker. Will come see pt in ER. Pt is febrile, tylenol suppository ordered. Pt is to be NPO. Antibiotics ordered.   Results for orders placed during the hospital encounter of 12/23/10  CBC      Component Value Range   WBC 22.8 (*) 4.0 - 10.5 (K/uL)   RBC 4.21 (*) 4.22 - 5.81 (MIL/uL)   Hemoglobin 14.7  13.0 - 17.0 (g/dL)   HCT 41.0  39.0 - 52.0 (%)   MCV 97.4  78.0 - 100.0 (fL)   MCH 34.9 (*) 26.0 - 34.0 (pg)   MCHC 35.9  30.0 - 36.0 (g/dL)   RDW 40.9  81.1 - 91.4 (%)   Platelets 197  150 - 400 (K/uL)   Ct Soft Tissue Neck W Contrast  12/23/2010  *RADIOLOGY REPORT*  Clinical Data: Left neck abscess.  CT NECK WITH CONTRAST  Technique:  Multidetector CT imaging of the neck was performed with intravenous contrast.  Contrast:  90 ml Omnipaque 300  Comparison: CT face 01/05/2010.  Findings: Extensive subcutaneous edematous changes are present throughout the left  neck.  There is a hypodense peripherally enhancing collection which extends to the skin surface from the left submandibular space. At least two small locules of air present within this collection.  There is a medial cortical defect associated with an apical abscess along the tooth number 18.  A large dental caries is present within that tooth as well.  There is a large dental caries and to a tooth number 30 as well.  The hypodense collection measures 4.5 x 4.5 x 4.4 cm. The left submandibular gland is intact.  The floor of the mouth and intrinsic tongue muscles are intact.  Multiple enlarged left cervical lymph nodes are likely reactive.  The bone windows demonstrate mild degenerative change at C5-6.  No focal lytic or blastic lesions are present.  The lung apices are clear.  IMPRESSION:  1.  Large hypodense peripherally enhancing fluid collection extending from the left submandibular space into the subcutaneous space of the left neck is compatible with an abscess. 2. Large dental caries and periapical abscess is associated with the left second mandibular molar (tooth number 18).  This appears to be the source of the abscess. 3.  Similar appearance of large dental caries and periapical lucency at too number 30. 4.  Extensive edematous changes throughout the left side of the neck and enlarged left-sided cervical lymph nodes are reactive.  No other discrete abscess is evident.  Original Report Authenticated By: Jamesetta Orleans. MATTERN, M.D.     MDM          Lottie Mussel, PA 12/23/10 1525  Lottie Mussel, [place] 12/23/10 1525

## 2010-12-23 NOTE — ED Notes (Signed)
MD at bed side to assess swollen area  on PT lt side of face

## 2010-12-23 NOTE — Addendum Note (Signed)
Addendum  created 12/23/10 2038 by Zenon Mayo, MD   Modules edited:Anesthesia Blocks and Procedures, Anesthesia Flowsheet, Inpatient Notes

## 2010-12-23 NOTE — H&P (Signed)
Russell Chan is an 25 y.o. male.   Chief Complaint: Dentoalveolar abscess with swelling left side HPI: Present x5 days  History reviewed. No pertinent past medical history.  No past surgical history on file.  No family history on file. Social History:  reports that he has been smoking.  He does not have any smokeless tobacco history on file. He reports that he drinks alcohol. He reports that he uses illicit drugs (Marijuana).  Allergies:  Allergies  Allergen Reactions  . Bee Anaphylaxis  . Onion Anaphylaxis  . Penicillins Swelling and Rash    Medications Prior to Admission  Medication Dose Route Frequency Provider Last Rate Last Dose  . 0.9 %  sodium chloride infusion   Intravenous Once Tatyana A Kirichenko, PA 100 mL/hr at 12/23/10 1258    . acetaminophen (TYLENOL) suppository 650 mg  650 mg Rectal Once Tatyana A Kirichenko, PA   650 mg at 12/23/10 1601  . clindamycin (CLEOCIN) IVPB 600 mg  600 mg Intravenous Once Tatyana A Kirichenko, PA   600 mg at 12/23/10 1545  . HYDROmorphone (DILAUDID) injection 1 mg  1 mg Intravenous Once Tatyana A Kirichenko, PA   1 mg at 12/23/10 1230  . HYDROmorphone (DILAUDID) injection 1 mg  1 mg Intravenous Once Tatyana A Kirichenko, PA   1 mg at 12/23/10 1601  . iohexol (OMNIPAQUE) 300 MG/ML injection 80 mL  80 mL Intravenous Once PRN Lyanne Co, MD       No current outpatient prescriptions on file as of 12/23/2010.    Results for orders placed during the hospital encounter of 12/23/10 (from the past 48 hour(s))  CBC     Status: Abnormal   Collection Time   12/23/10 12:22 PM      Component Value Range Comment   WBC 22.8 (*) 4.0 - 10.5 (K/uL)    RBC 4.21 (*) 4.22 - 5.81 (MIL/uL)    Hemoglobin 14.7  13.0 - 17.0 (g/dL)    HCT 16.1  09.6 - 04.5 (%)    MCV 97.4  78.0 - 100.0 (fL)    MCH 34.9 (*) 26.0 - 34.0 (pg)    MCHC 35.9  30.0 - 36.0 (g/dL)    RDW 40.9  81.1 - 91.4 (%)    Platelets 197  150 - 400 (K/uL)   BASIC METABOLIC PANEL      Status: Abnormal   Collection Time   12/23/10  3:20 PM      Component Value Range Comment   Sodium 131 (*) 135 - 145 (mEq/L)    Potassium 3.8  3.5 - 5.1 (mEq/L)    Chloride 96  96 - 112 (mEq/L)    CO2 22  19 - 32 (mEq/L)    Glucose, Bld 84  70 - 99 (mg/dL)    BUN 9  6 - 23 (mg/dL)    Creatinine, Ser 7.82  0.50 - 1.35 (mg/dL)    Calcium 8.9  8.4 - 10.5 (mg/dL)    GFR calc non Af Amer >90  >90 (mL/min)    GFR calc Af Amer >90  >90 (mL/min)    Ct Soft Tissue Neck W Contrast  12/23/2010  *RADIOLOGY REPORT*  Clinical Data: Left neck abscess.  CT NECK WITH CONTRAST  Technique:  Multidetector CT imaging of the neck was performed with intravenous contrast.  Contrast:  90 ml Omnipaque 300  Comparison: CT face 01/05/2010.  Findings: Extensive subcutaneous edematous changes are present throughout the left neck.  There is a hypodense  peripherally enhancing collection which extends to the skin surface from the left submandibular space. At least two small locules of air present within this collection.  There is a medial cortical defect associated with an apical abscess along the tooth number 18.  A large dental caries is present within that tooth as well.  There is a large dental caries and to a tooth number 30 as well.  The hypodense collection measures 4.5 x 4.5 x 4.4 cm. The left submandibular gland is intact.  The floor of the mouth and intrinsic tongue muscles are intact.  Multiple enlarged left cervical lymph nodes are likely reactive.  The bone windows demonstrate mild degenerative change at C5-6.  No focal lytic or blastic lesions are present.  The lung apices are clear.  IMPRESSION:  1.  Large hypodense peripherally enhancing fluid collection extending from the left submandibular space into the subcutaneous space of the left neck is compatible with an abscess. 2. Large dental caries and periapical abscess is associated with the left second mandibular molar (tooth number 18).  This appears to be the source  of the abscess. 3.  Similar appearance of large dental caries and periapical lucency at too number 30. 4.  Extensive edematous changes throughout the left side of the neck and enlarged left-sided cervical lymph nodes are reactive.  No other discrete abscess is evident.  Original Report Authenticated By: Jamesetta Orleans. MATTERN, M.D.    ROS  Blood pressure 115/71, pulse 105, temperature 102 F (38.9 C), temperature source Oral, resp. rate 20, SpO2 96.00%. Physical Exam  HENT:  Mouth/Throat:       Assessment/Plan Surgical removal of infected teeth. Incision and drainage extraoral infection. Supportive care.  Kandy Towery,JOSEPH L 12/23/2010, 4:26 PM

## 2010-12-23 NOTE — OR Nursing (Signed)
Throat Pack in 1850 out 1857

## 2010-12-23 NOTE — Brief Op Note (Signed)
12/23/2010  7:03 PM  PATIENT:  Russell Chan  25 y.o. 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  EBL:   25cc  BLOOD ADMINISTERED: No blood given  DRAINS: Quarter-inch by 3 inch fenestrated Penrose drain  LOCAL MEDICATIONS USED:  NONE  SPECIMEN:  Teeth  DISPOSITION OF SPECIMEN: Pathology COUNTS:  Count correct  TOURNIQUET:  None DICTATION: .Complete  PLAN OF CARE: Supportive care  PATIENT DISPOSITION:  To PACU   Delay start of Pharmacological VTE agent (>24hrs) due to surgical blood loss or risk of bleeding:  not applicable              12/23/2010  7:07 PM  PATIENT:  Russell Chan  25 y.o. male  PRE-OPERATIVE DIAGNOSIS:  * No pre-op diagnosis entered *  POST-OPERATIVE DIAGNOSIS:  * No post-op diagnosis entered *  PROCEDURE:  Procedure(s): DENTAL RESTORATION/EXTRACTIONS  SURGEON:  Surgeon(s): Hinton Dyer  PHYSICIAN ASSISTANT:   ASSISTANTS: Nurse tech   ANESTHESIA:   general  EBL:     BLOOD ADMINISTERED:none  DRAINS: Penrose drain in the Left face   LOCAL MEDICATIONS USED:  [id number]  SPECIMEN:  Aspirate and Excision  DISPOSITION OF SPECIMEN:  Laboratory  COUNTS:  Counts correct  TOURNIQUET:  None DICTATION: .Dragon Dictation  PLAN OF CARE: Admit to inpatient   PATIENT DISPOSITION:  PACU - hemodynamically stable.   Delay start of Pharmacological VTE agent (>24hrs) due to surgical blood loss or risk of bleeding:  no

## 2010-12-23 NOTE — Progress Notes (Signed)
This is a 25 year old white male who presents with a large left facial swelling extending into the submandibular area and into the left neck and chest. He has moderate to severe trismus. There is a history of decayed and nonrestorable teeth on the lower left side. The swelling on the left side is fluctuant and erythematous. The patient has no shortness of breath. He has mild dysphagia. Removal of the teeth involved and drainage of the swelling extraorally as indicated. He will be taken to the operating room for this procedure under general anesthesia. He will be admitted after the procedure

## 2010-12-23 NOTE — Anesthesia Preprocedure Evaluation (Addendum)
Anesthesia Evaluation  Patient identified by MRN, date of birth, ID band Patient awake    Reviewed: Allergy & Precautions, H&P , NPO status , Patient's Chart, lab work & pertinent test results  Airway Mallampati: IV TM Distance: >3 FB Neck ROM: Limited  Mouth opening: Limited Mouth Opening  Dental No notable dental hx. (+) Teeth Intact   Pulmonary neg pulmonary ROS, Current Smoker,    Pulmonary exam normal       Cardiovascular neg cardio ROS     Neuro/Psych Negative Neurological ROS  Negative Psych ROS   GI/Hepatic negative GI ROS, Neg liver ROS,   Endo/Other  Negative Endocrine ROS  Renal/GU negative Renal ROS  Genitourinary negative   Musculoskeletal negative musculoskeletal ROS (+)   Abdominal Normal abdominal exam  (+)   Peds negative pediatric ROS (+)  Hematology negative hematology ROS (+)   Anesthesia Other Findings   Reproductive/Obstetrics negative OB ROS                           Anesthesia Physical Anesthesia Plan  ASA: II and Emergent  Anesthesia Plan: General   Post-op Pain Management:    Induction:   Airway Management Planned: Awake Intubation Planned  Additional Equipment:   Intra-op Plan:   Post-operative Plan: Extubation in OR  Informed Consent: I have reviewed the patients History and Physical, chart, labs and discussed the procedure including the risks, benefits and alternatives for the proposed anesthesia with the patient or authorized representative who has indicated his/her understanding and acceptance.     Plan Discussed with: CRNA and Surgeon  Anesthesia Plan Comments:         Anesthesia Quick Evaluation

## 2010-12-23 NOTE — Anesthesia Postprocedure Evaluation (Signed)
  Anesthesia Post-op Note  Patient: Russell Chan  Procedure(s) Performed:  DENTAL RESTORATION/EXTRACTIONS  Patient Location: PACU  Anesthesia Type: General  Level of Consciousness: sedated  Airway and Oxygen Therapy: Patient Spontanous Breathing and Patient connected to nasal cannula oxygen  Post-op Pain: mild  Post-op Assessment: Post-op Vital signs reviewed, Patient's Cardiovascular Status Stable, Respiratory Function Stable, Patent Airway and Pain level controlled  Post-op Vital Signs: stable  Complications: No apparent anesthesia complications

## 2010-12-23 NOTE — Addendum Note (Signed)
Addendum  created 12/23/10 2041 by Zenon Mayo, MD   Modules edited:Anesthesia Review and Sign Navigator Section

## 2010-12-23 NOTE — Op Note (Signed)
NAMEBELL, CAI NO.:  [id number]  MEDICAL RECORD NO.:  [id number]  LOCATION:  5155                         FACILITY:  [facility]  PHYSICIAN:  Hinton Dyer, D.D.S.DATE OF BIRTH:  03/04/1985  DATE OF PROCEDURE:  12/23/2010 DATE OF DISCHARGE:                              OPERATIVE REPORT   PREOPERATIVE DIAGNOSIS:  Dental alveolar abscess secondary to multiple decayed teeth.  POSTOPERATIVE DIAGNOSIS:  Dental alveolar abscess secondary to multiple decayed teeth.  PROCEDURE:  Incision and drainage with anaerobic and aerobic cultures and surgical removal of teeth #17 and 18.  SURGEON:  Hinton Dyer, DDS  ANESTHESIA:  General.  DRAINS:  A quarter-inch x 3 inch Penrose drain.  ESTIMATED BLOOD LOSS:  25 mL.  CONDITION END OF SURGERY:  Good.  DESCRIPTION:  Following preoperative medication, the patient was brought to the operating room in a supine position in which he remained throughout the whole procedure.  He was intubated via an oral endotracheal tube and prepped in the usual fashion for an extraoral/intraoral procedure.  After the face was prepped and draped, the large swelling was aspirated and 3 mL of purulent material was removed and submitted for anaerobic and aerobic cultures.  A 1-cm incision was then made through the skin and subcutaneous tissue, which began draining a large amount of purulent material.  Approximately 25-30 mL purulence came out of the area.  A quarter inch x 3 inch fenestrated Penrose drain was then inserted into the defect and sutured in place with a 4-0 nylon suture.  At this point, the area was packed off and the mouth was opened up.  A child size bite block was placed on the patient's right side.  The endotracheal tube was pushed over to the right side, and the throat was suctioned out and packed off with a moist open 4 x 4 gauze.  A periosteal elevator reflected a full-thickness mucoperiosteal flap around #17 and #18.   #18 was removed with a lower universal forceps.  The socket was curetted and purulence was coming out of the socket.  #17 was also slightly mobile and was removed at the same time.  The socket was curetted and irrigated, and a 3-0 chromic suture was placed into the defect.  The area was packed off, the throat pack was removed, then the patient was extubated on the table and returned to the recovery room in good condition.          ______________________________ Hinton Dyer, D.D.S.     JLM/MEDQ  D:  12/23/2010  T:  12/23/2010  Job:  [id number]

## 2010-12-23 NOTE — Addendum Note (Signed)
Addendum  created 12/23/10 2041 by W. Edmond Camya Haydon, MD   Modules edited:Anesthesia Review and Sign Navigator Section    

## 2010-12-23 NOTE — ED Notes (Signed)
Patient reports onset of facial and neck swelling last week.  He started pcn on 11-04.  He has increased swelling and now has diff breathing and swallowing.  Patient airway is patent at this time.  Gross swelling noted to the left side of patient's face and neck

## 2010-12-23 NOTE — ED Notes (Signed)
Pt. Just transferred to x-ray and Ct scan.

## 2010-12-23 NOTE — ED Provider Notes (Signed)
Medical screening examination/treatment/procedure(s) were conducted as a shared visit with non-physician practitioner(s) and myself.  I personally evaluated the patient during the encounter  Patient's CT scan demonstrates large likely odontogenic in origin abscess of the left neck.  Patient has significant cellulitis extending across his left neck and left chest consistent with abscess with infected cellulitis.  IV antibiotics given.  The oral surgeon will likely take the patient to the operating room for incision and drainage.  I personally reviewed the patient's CT scan  Diagnosis: left mandibular abscess of odontogenic souce   Lyanne Co, MD 12/23/10 1530

## 2010-12-23 NOTE — Transfer of Care (Signed)
Immediate Anesthesia Transfer of Care Note  Patient: Russell Chan  Procedure(s) Performed:  DENTAL RESTORATION/EXTRACTIONS  Patient Location: PACU  Anesthesia Type: General  Level of Consciousness: awake, alert , oriented and patient cooperative  Airway & Oxygen Therapy: Patient Spontanous Breathing and Patient connected to nasal cannula oxygen  Post-op Assessment: Report given to PACU RN, Post -op Vital signs reviewed and stable, Patient moving all extremities, Patient moving all extremities X 4 and Patient able to stick tongue midline  Post vital signs: Reviewed and stable  Complications: No apparent anesthesia complications

## 2010-12-24 ENCOUNTER — Encounter (HOSPITAL_COMMUNITY): Payer: Self-pay | Admitting: *Deleted

## 2010-12-24 NOTE — Progress Notes (Signed)
1 Day Post-Op  Subjective:  Dictation on: 12/24/2010 10:03 AM by: Royston Sinner [[id number]]     Objective: Vital signs in last 24 hours: Temp:  [98.1 F (36.7 C)-102 F (38.9 C)] 98.4 F (36.9 C) (11/07 0524) Pulse Rate:  [60-124] 60  (11/07 0524) Resp:  [12-20] 18  (11/07 0524) BP: (114-139)/(60-91) 114/60 mmHg (11/07 0524) SpO2:  [96 %-100 %] 97 % (11/07 0524) Weight:  [58.695 kg (129 lb 6.4 oz)] 129 lb 6.4 oz (58.695 kg) (11/06 2109) Last BM Date: 12/23/10  Intake/Output from previous day: 11/06 0701 - 11/07 0700 In: 2264 [I.V.:2264] Out: 1150 [Urine:1150] Intake/Output this shift:    Head: Normocephalic, without obvious abnormality, atraumatic  Lab Results:   Basename 12/23/10 1222  WBC 22.8*  HGB 14.7  HCT 41.0  PLT 197   BMET  Basename 12/23/10 1520  NA 131*  K 3.8  CL 96  CO2 22  GLUCOSE 84  BUN 9  CREATININE 0.69  CALCIUM 8.9   PT/INR No results found for this basename: LABPROT:2,INR:2 in the last 72 hours ABG No results found for this basename: PHART:2,PCO2:2,PO2:2,HCO3:2 in the last 72 hours  Studies/Results: Ct Soft Tissue Neck W Contrast  12/23/2010  *RADIOLOGY REPORT*  Clinical Data: Left neck abscess.  CT NECK WITH CONTRAST  Technique:  Multidetector CT imaging of the neck was performed with intravenous contrast.  Contrast:  90 ml Omnipaque 300  Comparison: CT face 01/05/2010.  Findings: Extensive subcutaneous edematous changes are present throughout the left neck.  There is a hypodense peripherally enhancing collection which extends to the skin surface from the left submandibular space. At least two small locules of air present within this collection.  There is a medial cortical defect associated with an apical abscess along the tooth number 18.  A large dental caries is present within that tooth as well.  There is a large dental caries and to a tooth number 30 as well.  The hypodense collection measures 4.5 x 4.5 x 4.4 cm. The left submandibular  gland is intact.  The floor of the mouth and intrinsic tongue muscles are intact.  Multiple enlarged left cervical lymph nodes are likely reactive.  The bone windows demonstrate mild degenerative change at C5-6.  No focal lytic or blastic lesions are present.  The lung apices are clear.  IMPRESSION:  1.  Large hypodense peripherally enhancing fluid collection extending from the left submandibular space into the subcutaneous space of the left neck is compatible with an abscess. 2. Large dental caries and periapical abscess is associated with the left second mandibular molar (tooth number 18).  This appears to be the source of the abscess. 3.  Similar appearance of large dental caries and periapical lucency at too number 30. 4.  Extensive edematous changes throughout the left side of the neck and enlarged left-sided cervical lymph nodes are reactive.  No other discrete abscess is evident.  Original Report Authenticated By: Jamesetta Orleans. MATTERN, M.D.    Anti-infectives: Anti-infectives     Start     Dose/Rate Route Frequency Ordered Stop   12/23/10 2145   clindamycin (CLEOCIN) IVPB 600 mg  Status:  Discontinued        600 mg 100 mL/hr over 30 Minutes Intravenous 3 times per day 12/23/10 2139 12/23/10 2144   12/23/10 1500   clindamycin (CLEOCIN) IVPB 600 mg        600 mg 100 mL/hr over 30 Minutes Intravenous  Once 12/23/10 1439 12/23/10 1615  Assessment/Plan: s/p Procedure(s): Extraoral Incision and Drainage,  anerobic aerobic cultures, Surgical extractions #17,18 IRRIGATION AND DEBRIDEMENT ABSCESS Continue ABX therapy due to Post-op infection  LOS: 1 day    Chana Lindstrom,JOSEPH L 12/24/2010

## 2010-12-24 NOTE — Plan of Care (Signed)
Problem: Phase I Progression Outcomes Goal: Tubes/drains patent Outcome: Progressing Penrose drain

## 2010-12-25 ENCOUNTER — Encounter (HOSPITAL_COMMUNITY): Payer: Self-pay

## 2010-12-25 MED ORDER — CLINDAMYCIN HCL 300 MG PO CAPS: 300.0000 mg | ORAL_CAPSULE | Freq: Three times a day (TID) | ORAL | Status: AC

## 2010-12-25 MED ORDER — HYDROCODONE-ACETAMINOPHEN 5-325 MG PO TABS: 1.0000 | ORAL_TABLET | ORAL | Status: AC | PRN

## 2010-12-25 NOTE — Progress Notes (Signed)
Patient discharged to home in care of aunt and uncle. Medications and instructions reviewed with patient with no questions. Patient will go straight over to Dr. Rondel Baton office for possible drain removal and dressing change. IV d/c'd with cath intact. No changes from am assessment.

## 2010-12-25 NOTE — Discharge Summary (Signed)
Physician Discharge Summary  Patient ID: Russell Chan MRN: [id number] DOB/AGE: [birth year] 25 y.o.  Admit date: 12/23/2010 Discharge date: 12/25/2010  Admission Diagnoses:  Discharge Diagnoses:  Active Problems:  * No active hospital problems. *    Discharged Condition: good  Hospital Course: will follow up in office  Consults: none  Significant Diagnostic Studies: microbiology: abscess   Treatments: surgery: antibiotics, supportive care  Discharge Exam: Blood pressure 103/73, pulse 68, temperature 98.1 F (36.7 C), temperature source Oral, resp. rate 16, height 5\' 7"  (1.702 m), weight 58.695 kg (129 lb 6.4 oz), SpO2 99.00%. Head: Normocephalic, without obvious abnormality, atraumatic  Disposition:to office and than home     Current Discharge Medication List    CONTINUE these medications which have NOT CHANGED\ Clindamycin 300mg ,x30 1 qid   Details  HYDROcodone-acetaminophen (VICODIN) 5-500 MG per tablet Take 1 tablet by mouth every 6 (six) hours as needed. For pain       STOP taking these medications     penicillin v potassium (VEETID) 500 MG tablet          Signed: Rosmary Dionisio,JOSEPH L 12/25/2010, 9:18 AM

## 2010-12-27 LAB — CULTURE, ROUTINE-ABSCESS

## 2010-12-29 ENCOUNTER — Encounter (HOSPITAL_COMMUNITY): Payer: Self-pay | Admitting: Oral Surgery

## 2010-12-31 LAB — ANAEROBIC CULTURE

## 2015-10-05 ENCOUNTER — Emergency Department (HOSPITAL_BASED_OUTPATIENT_CLINIC_OR_DEPARTMENT_OTHER)
Admission: EM | Admit: 2015-10-05 | Discharge: 2015-10-05 | Disposition: A | Discharge status: Home / Self Care | Payer: No Typology Code available for payment source | Attending: Emergency Medicine | Admitting: Emergency Medicine

## 2015-10-05 ENCOUNTER — Encounter (HOSPITAL_BASED_OUTPATIENT_CLINIC_OR_DEPARTMENT_OTHER): Payer: Self-pay | Admitting: *Deleted

## 2015-10-05 ENCOUNTER — Emergency Department (HOSPITAL_BASED_OUTPATIENT_CLINIC_OR_DEPARTMENT_OTHER): Payer: No Typology Code available for payment source

## 2015-10-05 DIAGNOSIS — Y92410 Unspecified street and highway as the place of occurrence of the external cause: Secondary | ICD-10-CM | POA: Diagnosis not present

## 2015-10-05 DIAGNOSIS — S161XXA Strain of muscle, fascia and tendon at neck level, initial encounter: Secondary | ICD-10-CM | POA: Insufficient documentation

## 2015-10-05 DIAGNOSIS — M542 Cervicalgia: Secondary | ICD-10-CM | POA: Diagnosis present

## 2015-10-05 DIAGNOSIS — R1110 Vomiting, unspecified: Secondary | ICD-10-CM | POA: Diagnosis not present

## 2015-10-05 DIAGNOSIS — Y999 Unspecified external cause status: Secondary | ICD-10-CM | POA: Diagnosis not present

## 2015-10-05 DIAGNOSIS — Y939 Activity, unspecified: Secondary | ICD-10-CM | POA: Insufficient documentation

## 2015-10-05 DIAGNOSIS — F1290 Cannabis use, unspecified, uncomplicated: Secondary | ICD-10-CM | POA: Diagnosis not present

## 2015-10-05 DIAGNOSIS — F17210 Nicotine dependence, cigarettes, uncomplicated: Secondary | ICD-10-CM | POA: Insufficient documentation

## 2015-10-05 DIAGNOSIS — R51 Headache: Secondary | ICD-10-CM | POA: Diagnosis not present

## 2015-10-05 MED ORDER — DEXTROSE 50 % IV SOLN: 0.5000 | Freq: Once | INTRAVENOUS | Status: DC

## 2015-10-05 MED ORDER — HYDROCODONE-ACETAMINOPHEN 5-325 MG PO TABS: 2.0000 | ORAL_TABLET | ORAL | 0 refills | Status: DC | PRN
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
## 2016-07-21 ENCOUNTER — Emergency Department (HOSPITAL_BASED_OUTPATIENT_CLINIC_OR_DEPARTMENT_OTHER)
Admission: EM | Admit: 2016-07-21 | Discharge: 2016-07-22 | Disposition: A | Discharge status: Home / Self Care | Payer: Self-pay | Attending: Emergency Medicine | Admitting: Emergency Medicine

## 2016-07-21 ENCOUNTER — Encounter (HOSPITAL_BASED_OUTPATIENT_CLINIC_OR_DEPARTMENT_OTHER): Payer: Self-pay

## 2016-07-21 DIAGNOSIS — F17210 Nicotine dependence, cigarettes, uncomplicated: Secondary | ICD-10-CM | POA: Insufficient documentation

## 2016-07-21 DIAGNOSIS — K623 Rectal prolapse: Secondary | ICD-10-CM | POA: Insufficient documentation

## 2016-07-21 NOTE — ED Triage Notes (Signed)
Pt states he was moving yesterday and "lifted too hard."  C/o pain and some bleeding, says he "tried to push it back in," thinks his intestine is coming out of his butt

## 2016-07-22 MED ORDER — HYDROMORPHONE HCL 1 MG/ML IJ SOLN
1.0000 mg | Freq: Once | INTRAMUSCULAR | Status: AC
  Administered 2016-07-22: 1 mg via INTRAMUSCULAR
  Filled 2016-07-22: qty 1

## 2016-07-22 MED ORDER — DOCUSATE SODIUM 100 MG PO CAPS: 100.0000 mg | ORAL_CAPSULE | Freq: Two times a day (BID) | ORAL | 0 refills | Status: DC

## 2016-07-22 MED ORDER — ONDANSETRON 8 MG PO TBDP
8.0000 mg | ORAL_TABLET | Freq: Once | ORAL | Status: AC
  Administered 2016-07-22: 8 mg via ORAL
  Filled 2016-07-22: qty 1

## 2016-07-22 MED ORDER — POLYETHYLENE GLYCOL 3350 17 G PO PACK: 17.0000 g | PACK | Freq: Every day | ORAL | 0 refills | Status: DC

## 2016-07-22 NOTE — ED Notes (Signed)
Assumed care of patient from [place], [place]. Pt resting quietly. No distress. Family at side. Awaiting EDP procedure. States pain improved.

## 2016-07-22 NOTE — ED Provider Notes (Signed)
TIME SEEN: 12:19 AM  CHIEF COMPLAINT: "I feel something coming out of my butt"  HPI: Patient is a 31 year old male with no known past medical history who presents emergency department with feeling of fullness at his rectum. States he was helping a friend move lifting heavy objects and noticed discomfort in his rectum. States he felt something in this area. States he noticed a small amount of blood with his bowel movements. No clots, hemorrhaging, melena. States because of the pain he has not been able to have a bowel movement in the past 24 hours. Has had nausea. No fevers or chills. No history of abdominal surgery. Has never had similar symptoms.  ROS: See HPI Constitutional: no fever  Eyes: no drainage  ENT: no runny nose   Cardiovascular:  no chest pain  Resp: no SOB  GI: no vomiting GU: no dysuria Integumentary: no rash  Allergy: no hives  Musculoskeletal: no leg swelling  Neurological: no slurred speech ROS otherwise negative  PAST MEDICAL HISTORY/PAST SURGICAL HISTORY:  History reviewed. No pertinent past medical history.  MEDICATIONS:  Prior to Admission medications   Medication Sig Start Date End Date Taking? Authorizing Provider  HYDROcodone-acetaminophen (NORCO/VICODIN) 5-325 MG tablet Take 2 tablets by mouth every 4 (four) hours as needed. 10/05/15   Nelva NayBeaton, Robert, MD    ALLERGIES:  Allergies  Allergen Reactions  . Bee Venom Anaphylaxis  . Nutritional Supplements Anaphylaxis  . Onion Anaphylaxis  . Penicillins Swelling and Rash    SOCIAL HISTORY:  Social History  Substance Use Topics  . Smoking status: Current Every Day Smoker    Packs/day: 0.50    Years: 5.00    Types: Cigarettes  . Smokeless tobacco: Never Used  . Alcohol use Yes    FAMILY HISTORY: No family history on file.  EXAM: BP (!) 129/92 (BP Location: Right Arm)   Pulse 99   Temp 98.3 F (36.8 C) (Oral)   Resp 20   Ht 5\' 8"  (1.727 m)   Wt 61.2 kg (135 lb)   SpO2 93%   BMI 20.53 kg/m   CONSTITUTIONAL: Alert and oriented and responds appropriately to questions. Well-appearing; well-nourished HEAD: Normocephalic EYES: Conjunctivae clear, pupils appear equal, EOMI ENT: normal nose; moist mucous membranes NECK: Supple, no meningismus, no nuchal rigidity, no LAD  CARD: RRR; S1 and S2 appreciated; no murmurs, no clicks, no rubs, no gallops RESP: Normal chest excursion without splinting or tachypnea; breath sounds clear and equal bilaterally; no wheezes, no rhonchi, no rales, no hypoxia or respiratory distress, speaking full sentences ABD/GI: Normal bowel sounds; non-distended; soft, non-tender, no rebound, no guarding, no peritoneal signs, no hepatosplenomegaly RECTAL:  Patient has a small rectal prolapse. No hemorrhoids noted. No active bleeding or melena. No surrounding erythema, warmth. No crepitus. Tissue that is protruding from the rectum is pink and normal-appearing without ecchymosis, necrosis. BACK:  The back appears normal and is non-tender to palpation, there is no CVA tenderness EXT: Normal ROM in all joints; non-tender to palpation; no edema; normal capillary refill; no cyanosis, no calf tenderness or swelling    SKIN: Normal color for age and race; warm; no rash NEURO: Moves all extremities equally PSYCH: The patient's mood and manner are appropriate. Grooming and personal hygiene are appropriate.  MEDICAL DECISION MAKING: Patient here with small rectal prolapse. Patient has been given IM Dilaudid and we have put sugar on the rectum for one hour to help reduce it. I was able to reduce the rectal prolapse easily with direct  pressure. Patient was very agitated during this procedure, cursing and had to be redirected several times. The prolapse was easily reducible but patient was fighting against staff during the procedure which made the prolapse recur several times. Finally was able to get patient to relax enough to fully reduce.  Recommended surgery follow-up if symptoms  continue. Recommended water and increase fiber intake, Colace and MiraLAX to keep his stool soft.   At this time, I do not feel there is any life-threatening condition present. I have reviewed and discussed all results (EKG, imaging, lab, urine as appropriate) and exam findings with patient/family. I have reviewed nursing notes and appropriate previous records.  I feel the patient is safe to be discharged home without further emergent workup and can continue workup as an outpatient as needed. Discussed usual and customary return precautions. Patient/family verbalize understanding and are comfortable with this plan.  Outpatient follow-up has been provided if needed. All questions have been answered.   Reduction of rectal prolapse Performed by: Raelyn Number Authorized by: Raelyn Number Consent: Verbal consent obtained. Risks and benefits: risks, benefits and alternatives were discussed Consent given by: patient Required items: required blood products, implants, devices, and special equipment available Time out: Immediately prior to procedure a "time out" was called to verify the correct patient, procedure, equipment, support staff and site/side marked as required.  Patient sedated: given Dilaudid  Vitals: Vital signs were monitored during sedation. Patient tolerance: Patient tolerated the procedure well with no immediate complications.  Reduce patient's rectal prolapse using direct pressure. I was able to fully reduce the rectal prolapse but patient did have several episodes of recurrence as he was agitated, bearing down during the procedure. Finally we were able to fully reduce the prolapse without reoccurrence prior to discharge.      Ward, Layla Maw, DO 07/22/16 [phone]
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
## 2018-09-26 ENCOUNTER — Ambulatory Visit (HOSPITAL_COMMUNITY)
Admission: RE | Admit: 2018-09-26 | Discharge: 2018-09-26 | Disposition: A | Discharge status: Home / Self Care | Payer: No Typology Code available for payment source | Attending: Psychiatry | Admitting: Psychiatry

## 2018-09-26 DIAGNOSIS — G4700 Insomnia, unspecified: Secondary | ICD-10-CM | POA: Diagnosis not present

## 2018-09-26 DIAGNOSIS — F17210 Nicotine dependence, cigarettes, uncomplicated: Secondary | ICD-10-CM | POA: Insufficient documentation

## 2018-09-26 DIAGNOSIS — F1120 Opioid dependence, uncomplicated: Secondary | ICD-10-CM | POA: Diagnosis not present

## 2018-09-26 NOTE — H&P (Signed)
Behavioral Health Medical Screening Exam  Russell ShineSteven A Chan is an 33 y.o. male presented to [facility] voluntary as a walk-in, in search of detoxing form heroin. During his assessment the patient discussed his inaptitude to stop the use of all substances, but mainly heroin. The patient endorses the use of crack and meth. He voiced that he is able to stop using both. He admits that his challenge is stopping the use of heroin. The patient disclosed that he has been using $200-$300 daily on heroin. During his assessment the patient presented with multiple needle track marks on bilateral arms. The patient admit to dealing with insomnia and appetite being "not so good."  The patient was seen face-to-face by this provider; chart reviewed and consulted with Ms. Al CorpusLatisha Alston, TTS Counselor on 09/26/2018 due to the care of the patient. It was discussed that the patient does not meet criteria to be admitted to the inpatient unit.  On evaluation the patient is alert and oriented x4, presenting with some anxiety. The patient voiced "I am a little anxious because I am here." He remained cooperative, and mood-congruent with affect. The patient does not appear to be responding to internal or external stimuli. Neither is the patient presenting with any delusional thinking. The patient denies auditory or visual hallucinations. The patient denies any suicidal, homicidal, or self-harm ideations. The patient contracted for safety. "No, I do not want to hurt myself or anyone else. I can't own any guns or weapons I am a ex-con. I am not trying to go back to prison."  The patient voiced "I have my daughters to be here for. I was in prison and away from them. I want to be here for them." The patient is not presenting with any psychotic or paranoid behaviors. During an encounter with the patient, he was able to answer questions appropriately. The patient stated he was seen by a psychiatrist as a young child and was diagnosed with ADHD.  He was placed on Adderal which he stopped taking. The patient states, he has no other psychiatric diagnosis. The patient was given outpatient resources to assist him in getting the help to detox off heroin. The patient was receptive to the literature and voiced "when I get clean I am moving out of the area. I cannot hang around these people (friends) they are bad for me."  Per Ms. Raul DelAlston, TTS Counselor: Collateral was obtained by the patient uncle Lynita LombardBill Smith; stated patient has been depressed and needs detox. Bill stated patient was just released from probation and was putting off treatment until family and friends stepped in today. Bill reported no guns or weapons in the home.     Plan: The patient is not a safety risk to self or others and does not require psychiatric inpatient admission for stabilization and treatment.  Total Time spent with patient: 45 minutes  Psychiatric Specialty Exam: Physical Exam  Nursing note reviewed. Constitutional: He is oriented to person, place, and time. He appears well-developed.  HENT:  Head: Normocephalic.  Neck: Normal range of motion. Neck supple.  Cardiovascular: Normal rate.  Respiratory: Effort normal.  Musculoskeletal: Normal range of motion.  Neurological: He is alert and oriented to person, place, and time.  Psychiatric: He has a normal mood and affect. His behavior is normal. Judgment and thought content normal.    Review of Systems  Psychiatric/Behavioral: The patient is nervous/anxious.   All other systems reviewed and are negative.   There were no vitals taken for this  visit.There is no height or weight on file to calculate BMI.  General Appearance: Disheveled  Eye Contact:  Fair  Speech:  Clear and Coherent  Volume:  Normal  Mood:  Anxious  Affect:  Congruent  Thought Process:  Coherent  Orientation:  Full (Time, Place, and Person)  Thought Content:  Logical  Suicidal Thoughts:  No  Homicidal Thoughts:  No  Memory:  Immediate;    Good Recent;   Good Remote;   Good  Judgement:  Good  Insight:  Good  Psychomotor Activity:  Normal  Concentration: Concentration: Good and Attention Span: Good  Recall:  Good  Fund of Knowledge:Good  Language: Good  Akathisia:  Negative  Handed:  Right  AIMS (if indicated):     Assets:  Communication Skills Desire for Improvement Social Support  Sleep:   Insomnia    Musculoskeletal: Strength & Muscle Tone: within normal limits Gait & Station: normal Patient leans: N/A  There were no vitals taken for this visit.  Recommendations:  Based on my evaluation the patient does not appear to have an emergency medical condition.  Caroline Sauger, NP 09/26/2018, 9:48 PM

## 2018-09-26 NOTE — BH Assessment (Signed)
Assessment Note  Russell Chan is an 33 y.o. male presenting to [facility] as walk-in for heroin detox. Patient reported inability to stop using heroin on his on after several attempts. Patient reported using $200-$300 IV usage daily. Patient reported also using "crack and meth", stating "I can stop crack and meth, because I only use it when I don't have heroin". Patient denied SI, HI and psychosis. Patient able to contract for safety. No access to guns or weapons in the home. Patient denied past inpatient mental health services, prior suicide attempts and self-harming behaviors. Patient is not receiving any current outpatient mental health services.   Patient reported living with his uncle and that his wife and 2 daughters (38 and 68) are currently living with wife's father due to wife's fathers illness. Patient reported being released from prison 1 year ago and getting off probation last week. Patient reported insomnia and poor appetite. Patient was pleasant and cooperative during assessment.  Collateral Contact: Ihor Austin, stated patient has been depressed and needs detox. Bill stated patient was just released from probation and was putting off treatment until family and friends stepped in today. Bill reported no guns or weapons in the home.   Diagnosis: Opioid dependence  Past Medical History: No past medical history on file.  Past Surgical History:  Procedure Laterality Date  . IRRIGATION AND DEBRIDEMENT ABSCESS  12/23/2010   Procedure: IRRIGATION AND DEBRIDEMENT ABSCESS;  Surgeon: Ceasar Mons;  Location: [facility] OR;  Service: Oral Surgery;  Laterality: Left;  Irrigation and Debridement of Alevolar Abscess Left Neck  . NO PAST SURGERIES    . TOOTH EXTRACTION  12/23/2010   Procedure: DENTAL RESTORATION/EXTRACTIONS;  Surgeon: Ceasar Mons;  Location: [facility] OR;  Service: Oral Surgery;  Laterality: N/A;  Extraction of teeth #17, #18    Family History: No family history on file.  Social  History:  reports that he has been smoking cigarettes. He has a 2.50 pack-year smoking history. He has never used smokeless tobacco. He reports current alcohol use. He reports current drug use. Frequency: 3.00 times per week. Drug: Marijuana.  Additional Social History:  Alcohol / Drug Use Pain Medications: see MAR Prescriptions: see MAR Over the Counter: see MAR  CIWA:   COWS:    Allergies:  Allergies  Allergen Reactions  . Bee Venom Anaphylaxis  . Nutritional Supplements Anaphylaxis  . Onion Anaphylaxis  . Penicillins Swelling and Rash    Home Medications: (Not in a hospital admission)   OB/GYN Status:  No LMP for male patient.  General Assessment Data Location of Assessment: [facility] Assessment Services TTS Assessment: In system Is this a Tele or Face-to-Face Assessment?: Face-to-Face Is this an Initial Assessment or a Re-assessment for this encounter?: Initial Assessment Patient Accompanied by:: N/A Language Other than English: No Living Arrangements: Other (Comment)(family home with uncle) What gender do you identify as?: Male Marital status: Married Living Arrangements: Other relatives Can pt return to current living arrangement?: Yes Admission Status: Voluntary Is patient capable of signing voluntary admission?: Yes Referral Source: Self/Family/Friend     Crisis Care Plan Living Arrangements: Other relatives Legal Guardian: (self) Name of Psychiatrist: (none) Name of Therapist: (none)  Education Status Is patient currently in school?: No Is the patient employed, unemployed or receiving disability?: Unemployed  Risk to self with the past 6 months Suicidal Ideation: No Has patient been a risk to self within the past 6 months prior to admission? : No Suicidal Intent: No Has patient had any  suicidal intent within the past 6 months prior to admission? : No Is patient at risk for suicide?: No Suicidal Plan?: No Has patient had any suicidal plan within the past 6  months prior to admission? : No Access to Means: No What has been your use of drugs/alcohol within the last 12 months?: (heroin, crack and met) Previous Attempts/Gestures: No How many times?: (0) Other Self Harm Risks: (none reported) Triggers for Past Attempts: None known Intentional Self Injurious Behavior: None Family Suicide History: No Recent stressful life event(s): (housing, financial and family discord) Persecutory voices/beliefs?: No Depression: No Depression Symptoms: (denied) Substance abuse history and/or treatment for substance abuse?: No Suicide prevention information given to non-admitted patients: Not applicable  Risk to Others within the past 6 months Homicidal Ideation: No Does patient have any lifetime risk of violence toward others beyond the six months prior to admission? : No Thoughts of Harm to Others: No Current Homicidal Intent: No Current Homicidal Plan: No Access to Homicidal Means: No Identified Victim: (n/a) History of harm to others?: No Assessment of Violence: None Noted Violent Behavior Description: (none reported) Does patient have access to weapons?: No Criminal Charges Pending?: No Does patient have a court date: No Is patient on probation?: No  Psychosis Hallucinations: None noted Delusions: None noted  Mental Status Report Appearance/Hygiene: Unremarkable Eye Contact: Good Motor Activity: Freedom of movement Speech: Logical/coherent Level of Consciousness: Alert Mood: Anxious Affect: Anxious Anxiety Level: Moderate Thought Processes: Relevant, Coherent Judgement: Partial Orientation: Person, Place, Time, Situation Obsessive Compulsive Thoughts/Behaviors: None  Cognitive Functioning Concentration: Normal Memory: Recent Intact Is patient IDD: No Insight: Fair Impulse Control: Fair Appetite: Poor Have you had any weight changes? : No Change Sleep: Decreased Total Hours of Sleep: (insomnia) Vegetative Symptoms:  None  ADLScreening [facility] Assessment Services) Patient's cognitive ability adequate to safely complete daily activities?: Yes Patient able to express need for assistance with ADLs?: Yes Independently performs ADLs?: Yes (appropriate for developmental age)  Prior Inpatient Therapy Prior Inpatient Therapy: No  Prior Outpatient Therapy Prior Outpatient Therapy: No Does patient have an ACCT team?: No Does patient have Intensive In-House Services?  : No Does patient have [facility] services? : No Does patient have P4CC services?: No  ADL Screening (condition at time of admission) Patient's cognitive ability adequate to safely complete daily activities?: Yes Patient able to express need for assistance with ADLs?: Yes Independently performs ADLs?: Yes (appropriate for developmental age)   Disposition:  Disposition Initial Assessment Completed for this Encounter: Yes  NP, patient does not meet inpatient criteria. Patient given resources for substance abuse detox.   On Site Evaluation by:   Reviewed with Physician:    Venora Maples 09/26/2018 9:20 PM

## 2018-10-11 ENCOUNTER — Emergency Department (HOSPITAL_COMMUNITY)
Admission: EM | Admit: 2018-10-11 | Discharge: 2018-10-11 | Disposition: A | Discharge status: Home / Self Care | Payer: Self-pay | Attending: Emergency Medicine | Admitting: Emergency Medicine

## 2018-10-11 ENCOUNTER — Other Ambulatory Visit: Payer: Self-pay

## 2018-10-11 DIAGNOSIS — Z5321 Procedure and treatment not carried out due to patient leaving prior to being seen by health care provider: Secondary | ICD-10-CM | POA: Insufficient documentation

## 2018-10-11 NOTE — ED Notes (Signed)
Called pt to recheck vitals. No response.  

## 2018-10-11 NOTE — ED Notes (Signed)
Called pt 2x to recheck vitals. No response.  °

## 2018-10-11 NOTE — ED Triage Notes (Signed)
Pt states he wants to detox from heroine last used yesterday and would like some help getting into a rehab program.

## 2019-11-06 ENCOUNTER — Emergency Department (HOSPITAL_COMMUNITY)
Admission: EM | Admit: 2019-11-06 | Discharge: 2019-11-06 | Disposition: A | Discharge status: Home / Self Care | Payer: Self-pay | Attending: Emergency Medicine | Admitting: Emergency Medicine

## 2019-11-06 ENCOUNTER — Other Ambulatory Visit: Payer: Self-pay

## 2019-11-06 ENCOUNTER — Encounter (HOSPITAL_COMMUNITY): Payer: Self-pay

## 2019-11-06 DIAGNOSIS — F17210 Nicotine dependence, cigarettes, uncomplicated: Secondary | ICD-10-CM | POA: Insufficient documentation

## 2019-11-06 DIAGNOSIS — F1910 Other psychoactive substance abuse, uncomplicated: Secondary | ICD-10-CM | POA: Insufficient documentation

## 2019-11-06 NOTE — ED Provider Notes (Signed)
[facility]-EMERGENCY DEPT Provider Note   CSN: [id number] Arrival date & time: 11/06/19  2952     History Chief Complaint  Patient presents with  . Ingestion    Russell Chan. is a 34 y.o. male.  34 year old male with prior medical history as detailed below presents for evaluation.  Patient requests heroin detox.  Patient reports that he uses heroin on a near daily basis.  His last use of heroin was yesterday.  He reports injection of heroin.  He also uses cocaine.  He typically smokes this.  His last use of cocaine was 2 to 3 days prior.  He denies suicidality or homicidality.  He reports prior outpatient rehab within the last month.   Prior visit in the medical record - on August 10 - for same complaint. Patient reports that he went to outpatient rehab in [facility] following his ED visit on 8/10. He resumed heroin use shortly after leaving rehab.   The history is provided by the patient and medical records.  Ingestion This is a new problem. The current episode started 1 to 2 hours ago. The problem occurs constantly. The problem has not changed since onset.Pertinent negatives include no chest pain, no abdominal pain, no headaches and no shortness of breath. Nothing aggravates the symptoms. Nothing relieves the symptoms.       History reviewed. No pertinent past medical history.  There are no problems to display for this patient.   Past Surgical History:  Procedure Laterality Date  . IRRIGATION AND DEBRIDEMENT ABSCESS  12/23/2010   Procedure: IRRIGATION AND DEBRIDEMENT ABSCESS;  Surgeon: Hinton Dyer;  Location: [facility] OR;  Service: Oral Surgery;  Laterality: Left;  Irrigation and Debridement of Alevolar Abscess Left Neck  . NO PAST SURGERIES    . TOOTH EXTRACTION  12/23/2010   Procedure: DENTAL RESTORATION/EXTRACTIONS;  Surgeon: Hinton Dyer;  Location: [facility] OR;  Service: Oral Surgery;  Laterality: N/A;  Extraction of teeth #17, #18       History  reviewed. No pertinent family history.  Social History   Tobacco Use  . Smoking status: Current Every Day Smoker    Packs/day: 0.50    Years: 5.00    Pack years: 2.50    Types: Cigarettes  . Smokeless tobacco: Never Used  Substance Use Topics  . Alcohol use: Yes  . Drug use: Yes    Frequency: 3.0 times per week    Types: Marijuana    Home Medications Prior to Admission medications   Medication Sig Start Date End Date Taking? Authorizing Provider  docusate sodium (COLACE) 100 MG capsule Take 1 capsule (100 mg total) by mouth every 12 (twelve) hours. 07/22/16   Ward, Layla Maw, DO  HYDROcodone-acetaminophen (NORCO/VICODIN) 5-325 MG tablet Take 2 tablets by mouth every 4 (four) hours as needed. 10/05/15   Nelva Nay, MD  polyethylene glycol [facility]) packet Take 17 g by mouth daily. 07/22/16   Ward, Layla Maw, DO    Allergies    Bee venom, Nutritional supplements, Onion, and Penicillins  Review of Systems   Review of Systems  Respiratory: Negative for shortness of breath.   Cardiovascular: Negative for chest pain.  Gastrointestinal: Negative for abdominal pain.  Neurological: Negative for headaches.  All other systems reviewed and are negative.   Physical Exam Updated Vital Signs BP 120/78   Pulse 70   Temp 98.1 F (36.7 C) (Oral)   Resp 18   Ht 5\' 7"  (1.702 m)  Wt 56.2 kg   BMI 19.42 kg/m   Physical Exam Vitals and nursing note reviewed.  Constitutional:      General: He is not in acute distress.    Appearance: Normal appearance. He is well-developed.  HENT:     Head: Normocephalic and atraumatic.  Eyes:     Conjunctiva/sclera: Conjunctivae normal.     Pupils: Pupils are equal, round, and reactive to light.  Cardiovascular:     Rate and Rhythm: Normal rate and regular rhythm.     Heart sounds: Normal heart sounds.  Pulmonary:     Effort: Pulmonary effort is normal. No respiratory distress.     Breath sounds: Normal breath sounds.  Abdominal:      General: There is no distension.     Palpations: Abdomen is soft.     Tenderness: There is no abdominal tenderness.  Musculoskeletal:        General: No deformity. Normal range of motion.     Cervical back: Normal range of motion and neck supple.  Skin:    General: Skin is warm and dry.     Comments: Multiple injection track marks to BUE of various ages  Neurological:     General: No focal deficit present.     Mental Status: He is alert and oriented to person, place, and time.     ED Results / Procedures / Treatments   Labs (all labs ordered are listed, but only abnormal results are displayed) Labs Reviewed - No data to display  EKG None  Radiology No results found.  Procedures Procedures (including critical care time)  Medications Ordered in ED Medications - No data to display  ED Course  I have reviewed the triage vital signs and the nursing notes.  Pertinent labs & imaging results that were available during my care of the patient were reviewed by me and considered in my medical decision making (see chart for details).    MDM Rules/Calculators/A&P                          MDM  Screen complete  [name]. was evaluated in Emergency Department on 11/06/2019 for the symptoms described in the history of present illness. He was evaluated in the context of the global COVID-19 pandemic, which necessitated consideration that the patient might be at risk for infection with the SARS-CoV-2 virus that causes COVID-19. Institutional protocols and algorithms that pertain to the evaluation of patients at risk for COVID-19 are in a state of rapid change based on information released by regulatory bodies including the CDC and federal and state organizations. These policies and algorithms were followed during the patient's care in the ED.  Patient is requesting resources for assistance with heroin detox.  Patient does not meet criteria for inpatient treatment.  Outpatient  resources made available to the patient.  Patient does understand need for close follow-up.  Strict return precautions given understood.   Final Clinical Impression(s) / ED Diagnoses Final diagnoses:  Polysubstance abuse [facility])    Rx / DC Orders ED Discharge Orders    None       Wynetta Fines, MD 11/06/19 1238

## 2019-11-06 NOTE — ED Triage Notes (Signed)
Patient states want to detox off heroin. Last use last night. Pt states is daily heroin and cocaine user. States he uses clean needles.

## 2019-11-06 NOTE — Discharge Instructions (Addendum)
Please return for any problem.  °

## 2019-11-06 NOTE — [facility] Suicide Risk Assessment (Cosign Needed Addendum)
Suicide Risk Assessment  Discharge Assessment   [facility] Discharge Suicide Risk Assessment   Principal Problem: <principal problem not specified> Discharge Diagnoses: Active Problems:   * No active hospital problems. *   Total Time spent with patient: 30 minutes  Musculoskeletal: Strength & Muscle Tone: within normal limits Gait & Station: normal Patient leans: N/A  Psychiatric Specialty Exam: Review of Systems  Psychiatric/Behavioral: Positive for substance abuse (THC, Cocaine, Heroin). Negative for hallucinations and memory loss. Depression: Stable. Suicidal ideas: Denies. The patient is not nervous/anxious and does not have insomnia.        States he wants to get off drugs and get his life together  All other systems reviewed and are negative.    Blood pressure 115/60, pulse 71, temperature 98.1 F (36.7 C), temperature source Oral, resp. rate 15, height 5\' 7"  (1.702 m), weight 56.2 kg, SpO2 96 %.Body mass index is 19.42 kg/m.  General Appearance: Casual  Eye Contact::  Good  Speech:  Clear and Coherent and Normal Rate409  Volume:  Normal  Mood:  "High"  Affect:  Congruent  Thought Process:  Coherent, Goal Directed and Descriptions of Associations: Intact  Orientation:  Full (Time, Place, and Person)  Thought Content:  WDL  Suicidal Thoughts:  No  Homicidal Thoughts:  No  Memory:  Immediate;   Good Recent;   Good  Judgement:  Intact  Insight:  Present  Psychomotor Activity:  Decreased  Concentration:  Fair  Recall:  Good  Fund of Knowledge:Fair  Language: Good  Akathisia:  No  Handed:  Right  AIMS (if indicated):     Assets:  Communication Skills Desire for Improvement Housing Social Support  Sleep:     Cognition: WNL  ADL's:  Intact   Mental Status Per Nursing Assessment::   On Admission:    [id number]., 34 y.o., male patient seen face to face by this provider, consulted with Dr. 31; and chart reviewed on 11/06/19.  On evaluation Mico Spark. reports he came to the hospital because he needs detox from heroin.  Reports that he lives with his uncle and is employed.  States that his uncle is supportive. Patient states he has had prior rehab services and was clean for 2 years afterward.   During evaluation Manuella Ghazi. is alert/oriented x 4; calm/cooperative; and mood is congruent with affect.  He does not appear to be responding to internal/external stimuli or delusional thoughts.  Patient denies suicidal/self-harm/homicidal ideation, psychosis, and paranoia.  Patient answered question appropriately.     Demographic Factors:  Male and Caucasian  Loss Factors: None  Historical Factors: Impulsivity  Risk Reduction Factors:   Religious beliefs about death, Living with another person, especially a relative and Positive social support  Continued Clinical Symptoms:  Alcohol/Substance Abuse/Dependencies  Cognitive Features That Contribute To Risk:  None    Suicide Risk:  Minimal: No identifiable suicidal ideation.  Patients presenting with no risk factors but with morbid ruminations; may be classified as minimal risk based on the severity of the depressive symptoms    Plan Of Care/Follow-up recommendations:  Activity:  As tolerated Diet:  Heart healthy   Peer support ordered to assist with rehab services/detox  Disposition:  Psychiatrically cleared No evidence of imminent risk to self or others at present.   Patient does not meet criteria for psychiatric inpatient admission. Supportive therapy provided about ongoing stressors. Discussed crisis plan, support from social network, calling 911, coming to the Emergency Department,  and calling Suicide Hotline.  Josealberto Montalto, NP 11/06/2019, 12:00 PM

## 2021-10-01 ENCOUNTER — Observation Stay (HOSPITAL_COMMUNITY): Payer: Self-pay

## 2021-10-01 ENCOUNTER — Encounter (HOSPITAL_COMMUNITY): Payer: Self-pay | Admitting: Emergency Medicine

## 2021-10-01 ENCOUNTER — Observation Stay (HOSPITAL_COMMUNITY)
Admission: EM | Admit: 2021-10-01 | Discharge: 2021-10-02 | Disposition: A | Discharge status: Home / Self Care | Payer: Self-pay | Attending: Internal Medicine | Admitting: Internal Medicine

## 2021-10-01 ENCOUNTER — Other Ambulatory Visit: Payer: Self-pay

## 2021-10-01 ENCOUNTER — Emergency Department (HOSPITAL_COMMUNITY): Payer: Self-pay

## 2021-10-01 DIAGNOSIS — Z79899 Other long term (current) drug therapy: Secondary | ICD-10-CM | POA: Insufficient documentation

## 2021-10-01 DIAGNOSIS — J9601 Acute respiratory failure with hypoxia: Principal | ICD-10-CM | POA: Diagnosis present

## 2021-10-01 DIAGNOSIS — F17210 Nicotine dependence, cigarettes, uncomplicated: Secondary | ICD-10-CM | POA: Insufficient documentation

## 2021-10-01 DIAGNOSIS — R6511 Systemic inflammatory response syndrome (SIRS) of non-infectious origin with acute organ dysfunction: Secondary | ICD-10-CM | POA: Insufficient documentation

## 2021-10-01 DIAGNOSIS — T401X1A Poisoning by heroin, accidental (unintentional), initial encounter: Secondary | ICD-10-CM | POA: Insufficient documentation

## 2021-10-01 DIAGNOSIS — T50901A Poisoning by unspecified drugs, medicaments and biological substances, accidental (unintentional), initial encounter: Secondary | ICD-10-CM | POA: Diagnosis present

## 2021-10-01 DIAGNOSIS — J690 Pneumonitis due to inhalation of food and vomit: Secondary | ICD-10-CM | POA: Diagnosis present

## 2021-10-01 DIAGNOSIS — N179 Acute kidney failure, unspecified: Secondary | ICD-10-CM | POA: Diagnosis present

## 2021-10-01 DIAGNOSIS — T40411A Poisoning by fentanyl or fentanyl analogs, accidental (unintentional), initial encounter: Secondary | ICD-10-CM | POA: Insufficient documentation

## 2021-10-01 DIAGNOSIS — F1910 Other psychoactive substance abuse, uncomplicated: Secondary | ICD-10-CM | POA: Diagnosis present

## 2021-10-01 HISTORY — DX: Other psychoactive substance use, unspecified, uncomplicated: F19.90

## 2021-10-01 LAB — CBC WITH DIFFERENTIAL/PLATELET
Abs Immature Granulocytes: 0.07 10*3/uL (ref 0.00–0.07)
Basophils Absolute: 0.1 10*3/uL (ref 0.0–0.1)
Basophils Relative: 0 %
Eosinophils Absolute: 0 10*3/uL (ref 0.0–0.5)
Eosinophils Relative: 0 %
HCT: 45.4 % (ref 39.0–52.0)
Hemoglobin: 14.5 g/dL (ref 13.0–17.0)
Immature Granulocytes: 0 %
Lymphocytes Relative: 6 %
Lymphs Abs: 1.1 10*3/uL (ref 0.7–4.0)
MCH: 32.2 pg (ref 26.0–34.0)
MCHC: 31.9 g/dL (ref 30.0–36.0)
MCV: 100.9 fL — ABNORMAL HIGH (ref 80.0–100.0)
Monocytes Absolute: 1.4 10*3/uL — ABNORMAL HIGH (ref 0.1–1.0)
Monocytes Relative: 7 %
Neutro Abs: 17.7 10*3/uL — ABNORMAL HIGH (ref 1.7–7.7)
Neutrophils Relative %: 87 %
Platelets: 175 10*3/uL (ref 150–400)
RBC: 4.5 MIL/uL (ref 4.22–5.81)
RDW: 13.8 % (ref 11.5–15.5)
WBC: 20.4 10*3/uL — ABNORMAL HIGH (ref 4.0–10.5)
nRBC: 0 % (ref 0.0–0.2)

## 2021-10-01 LAB — BASIC METABOLIC PANEL
Anion gap: 13 (ref 5–15)
BUN: 18 mg/dL (ref 6–20)
CO2: 21 mmol/L — ABNORMAL LOW (ref 22–32)
Calcium: 8.5 mg/dL — ABNORMAL LOW (ref 8.9–10.3)
Chloride: 109 mmol/L (ref 98–111)
Creatinine, Ser: 1.76 mg/dL — ABNORMAL HIGH (ref 0.61–1.24)
GFR, Estimated: 51 mL/min — ABNORMAL LOW (ref >60)
Glucose, Bld: 96 mg/dL (ref 70–99)
Potassium: 3.7 mmol/L (ref 3.5–5.1)
Sodium: 143 mmol/L (ref 135–145)

## 2021-10-01 LAB — BLOOD GAS, VENOUS
Acid-base deficit: 4 mmol/L — ABNORMAL HIGH (ref 0.0–2.0)
Bicarbonate: 23.4 mmol/L (ref 20.0–28.0)
O2 Saturation: 91.5 %
Patient temperature: 37
pCO2, Ven: 51 mmHg (ref 44–60)
pH, Ven: 7.27 (ref 7.25–7.43)
pO2, Ven: 62 mmHg — ABNORMAL HIGH (ref 32–45)

## 2021-10-01 LAB — URINALYSIS, ROUTINE W REFLEX MICROSCOPIC
Bilirubin Urine: NEGATIVE
Glucose, UA: NEGATIVE mg/dL
Ketones, ur: NEGATIVE mg/dL
Leukocytes,Ua: NEGATIVE
Nitrite: NEGATIVE
Protein, ur: 30 mg/dL — AB
Specific Gravity, Urine: 1.02 (ref 1.005–1.030)
pH: 5 (ref 5.0–8.0)

## 2021-10-01 LAB — RAPID URINE DRUG SCREEN, HOSP PERFORMED
Amphetamines: POSITIVE — AB
Barbiturates: NOT DETECTED
Benzodiazepines: NOT DETECTED
Cocaine: POSITIVE — AB
Opiates: NOT DETECTED
Tetrahydrocannabinol: POSITIVE — AB

## 2021-10-01 LAB — HIV ANTIBODY (ROUTINE TESTING W REFLEX): HIV Screen 4th Generation wRfx: NONREACTIVE

## 2021-10-01 LAB — STREP PNEUMONIAE URINARY ANTIGEN: Strep Pneumo Urinary Antigen: NEGATIVE

## 2021-10-01 LAB — ETHANOL: Alcohol, Ethyl (B): <10 mg/dL (ref <10)

## 2021-10-01 MED ORDER — NALOXONE HCL 0.4 MG/ML IJ SOLN
0.4000 mg | Freq: Once | INTRAMUSCULAR | Status: AC
  Administered 2021-10-01: 0.4 mg via INTRAVENOUS
  Filled 2021-10-01: qty 1

## 2021-10-01 MED ORDER — LOPERAMIDE HCL 2 MG PO CAPS: 2.0000 mg | ORAL_CAPSULE | ORAL | Status: DC | PRN

## 2021-10-01 MED ORDER — FOLIC ACID 1 MG PO TABS
1.0000 mg | ORAL_TABLET | Freq: Every day | ORAL | Status: DC
Start: 2021-10-01 — End: 2021-10-02
  Administered 2021-10-01 – 2021-10-02 (×2): 1 mg via ORAL
  Filled 2021-10-01 (×2): qty 1

## 2021-10-01 MED ORDER — ACETAMINOPHEN 650 MG RE SUPP: 650.0000 mg | Freq: Four times a day (QID) | RECTAL | Status: DC | PRN

## 2021-10-01 MED ORDER — SODIUM CHLORIDE 0.9 % IV BOLUS
1000.0000 mL | Freq: Once | INTRAVENOUS | Status: AC
  Administered 2021-10-01: 1000 mL via INTRAVENOUS

## 2021-10-01 MED ORDER — NICOTINE 21 MG/24HR TD PT24
21.0000 mg | MEDICATED_PATCH | Freq: Every day | TRANSDERMAL | Status: DC
Start: 2021-10-01 — End: 2021-10-02
  Administered 2021-10-01: 21 mg via TRANSDERMAL
  Filled 2021-10-01 (×2): qty 1

## 2021-10-01 MED ORDER — METHOCARBAMOL 500 MG PO TABS
500.0000 mg | ORAL_TABLET | Freq: Three times a day (TID) | ORAL | Status: DC | PRN
  Administered 2021-10-01 (×2): 500 mg via ORAL
  Filled 2021-10-01 (×2): qty 1

## 2021-10-01 MED ORDER — DICYCLOMINE HCL 20 MG PO TABS: 20.0000 mg | ORAL_TABLET | Freq: Four times a day (QID) | ORAL | Status: DC | PRN

## 2021-10-01 MED ORDER — ACETAMINOPHEN 325 MG PO TABS: 650.0000 mg | ORAL_TABLET | Freq: Four times a day (QID) | ORAL | Status: DC | PRN

## 2021-10-01 MED ORDER — SODIUM CHLORIDE 0.9 % IV SOLN
2.0000 g | Freq: Two times a day (BID) | INTRAVENOUS | Status: DC
  Administered 2021-10-01: 2 g via INTRAVENOUS
  Filled 2021-10-01 (×2): qty 12.5

## 2021-10-01 MED ORDER — LACTATED RINGERS IV SOLN: INTRAVENOUS | Status: DC

## 2021-10-01 MED ORDER — THIAMINE HCL 100 MG PO TABS
100.0000 mg | ORAL_TABLET | Freq: Every day | ORAL | Status: DC
  Administered 2021-10-01 – 2021-10-02 (×2): 100 mg via ORAL
  Filled 2021-10-01 (×2): qty 1

## 2021-10-01 MED ORDER — ADULT MULTIVITAMIN W/MINERALS CH
1.0000 | ORAL_TABLET | Freq: Every day | ORAL | Status: DC
  Administered 2021-10-01 – 2021-10-02 (×2): 1 via ORAL
  Filled 2021-10-01 (×2): qty 1

## 2021-10-01 MED ORDER — NAPROXEN 500 MG PO TABS
500.0000 mg | ORAL_TABLET | Freq: Two times a day (BID) | ORAL | Status: DC | PRN
  Administered 2021-10-01: 500 mg via ORAL
  Filled 2021-10-01: qty 1

## 2021-10-01 MED ORDER — ONDANSETRON HCL 4 MG/2ML IJ SOLN
4.0000 mg | Freq: Once | INTRAMUSCULAR | Status: AC
  Administered 2021-10-01: 4 mg via INTRAVENOUS
  Filled 2021-10-01: qty 2

## 2021-10-01 MED ORDER — METRONIDAZOLE 500 MG/100ML IV SOLN
500.0000 mg | Freq: Two times a day (BID) | INTRAVENOUS | Status: DC
  Administered 2021-10-01 – 2021-10-02 (×2): 500 mg via INTRAVENOUS
  Filled 2021-10-01 (×2): qty 100

## 2021-10-01 MED ORDER — ONDANSETRON HCL 4 MG PO TABS: 4.0000 mg | ORAL_TABLET | Freq: Four times a day (QID) | ORAL | Status: DC | PRN

## 2021-10-01 MED ORDER — METRONIDAZOLE 500 MG/100ML IV SOLN
500.0000 mg | Freq: Once | INTRAVENOUS | Status: AC
  Administered 2021-10-01: 500 mg via INTRAVENOUS
  Filled 2021-10-01: qty 100

## 2021-10-01 MED ORDER — ONDANSETRON HCL 4 MG/2ML IJ SOLN: 4.0000 mg | Freq: Four times a day (QID) | INTRAMUSCULAR | Status: DC | PRN

## 2021-10-01 MED ORDER — HYDROXYZINE HCL 25 MG PO TABS: 25.0000 mg | ORAL_TABLET | Freq: Four times a day (QID) | ORAL | Status: DC | PRN

## 2021-10-01 MED ORDER — ENOXAPARIN SODIUM 40 MG/0.4ML IJ SOSY
40.0000 mg | PREFILLED_SYRINGE | INTRAMUSCULAR | Status: DC
  Administered 2021-10-01: 40 mg via SUBCUTANEOUS
  Filled 2021-10-01: qty 0.4

## 2021-10-01 MED ORDER — SODIUM CHLORIDE 0.9 % IV SOLN
2.0000 g | Freq: Once | INTRAVENOUS | Status: AC
  Administered 2021-10-01: 2 g via INTRAVENOUS
  Filled 2021-10-01: qty 12.5

## 2021-10-01 NOTE — ED Notes (Signed)
Pt endorses hip and back pain. No PRN meds ordered at this time.

## 2021-10-01 NOTE — ED Notes (Signed)
Respiratory called about patient potentially needing humidified oxygen due to nasal cannula at current rate drying his nose out.  PA Uvaldo Rising made aware and ok with plan.

## 2021-10-01 NOTE — H&P (Signed)
History and Physical    Patient: Russell Chan. CVE:[id number] DOB: November 19, 1985 DOA: 10/01/2021 DOS: the patient was seen and examined on 10/01/2021 PCP: Patient, No Pcp Per  Patient coming from: Home  Chief Complaint:  Chief Complaint  Patient presents with   Drug Overdose   HPI: Russell Chan. is a 36 y.o. male with medical history significant of polysubstance abuse. Presenting with drug overdose. He is unable to give me his history as he says he doesn't remember anything before waking up here this morning. Per chart review, he took an unknown amount of xanax, fentanyl and heroin. He then woke up with people around him. He denies any suicidal intentions. When asked about these events this morning, he denies remembering anything. He apparently was found by family unresponsive on the floor. They alerted EMS.   Review of Systems: As mentioned in the history of present illness. All other systems reviewed and are negative. Past Medical History:  Diagnosis Date   Drug use    heroin and xanax   Past Surgical History:  Procedure Laterality Date   IRRIGATION AND DEBRIDEMENT ABSCESS  12/23/2010   Procedure: IRRIGATION AND DEBRIDEMENT ABSCESS;  Surgeon: Hinton Dyer;  Location: [facility] OR;  Service: Oral Surgery;  Laterality: Left;  Irrigation and Debridement of Alevolar Abscess Left Neck   NO PAST SURGERIES     TOOTH EXTRACTION  12/23/2010   Procedure: DENTAL RESTORATION/EXTRACTIONS;  Surgeon: Hinton Dyer;  Location: [facility] OR;  Service: Oral Surgery;  Laterality: N/A;  Extraction of teeth #17, #18   Social History:  reports that he has been smoking cigarettes. He has a 2.50 pack-year smoking history. He has never used smokeless tobacco. He reports current alcohol use. He reports current drug use. Frequency: 3.00 times per week. Drugs: Marijuana and IV.  Allergies  Allergen Reactions   Bee Venom Anaphylaxis   Nutritional Supplements Anaphylaxis   Onion Anaphylaxis   Penicillins  Swelling and Rash    History reviewed. No pertinent family history.  Prior to Admission medications   Medication Sig Start Date End Date Taking? Authorizing Provider  docusate sodium (COLACE) 100 MG capsule Take 1 capsule (100 mg total) by mouth every 12 (twelve) hours. Patient not taking: Reported on 11/06/2019 07/22/16   Ward, Layla Maw, DO  HYDROcodone-acetaminophen (NORCO/VICODIN) 5-325 MG tablet Take 2 tablets by mouth every 4 (four) hours as needed. Patient not taking: Reported on 11/06/2019 10/05/15   Nelva Nay, MD  polyethylene glycol [facility]) packet Take 17 g by mouth daily. Patient not taking: Reported on 11/06/2019 07/22/16   Ward, Layla Maw, DO    Physical Exam: Vitals:   10/01/21 0545 10/01/21 0600 10/01/21 0615 10/01/21 0645  BP: 98/75 93/72 (!) 71/52 92/73  Pulse: 90 91 87 88  Resp: 12 12 12 11   Temp:      TempSrc:      SpO2: 97% 97% 96% 96%  Weight:      Height:       General: 36 y.o. male resting in bed in NAD Eyes: PERRL, normal sclera ENMT: Nares patent w/o discharge, orophaynx clear, dentition normal, ears w/o discharge/lesions/ulcers Neck: Supple, trachea midline Cardiovascular: RRR, +S1, S2, no m/g/r, equal pulses throughout Respiratory: CTABL, no w/r/r, normal WOB GI: BS+, NDNT, no masses noted, no organomegaly noted MSK: No e/c/c Neuro: A&O x 2 (name, place: says he's doesn't have time to keep up with the year or who the president is), no focal deficits Psyc: Appropriate interaction and  affect, calm/cooperative  Data Reviewed:  Lab Results  Component Value Date   NA 143 10/01/2021   K 3.7 10/01/2021   CO2 21 (L) 10/01/2021   GLUCOSE 96 10/01/2021   BUN 18 10/01/2021   CREATININE 1.76 (H) 10/01/2021   CALCIUM 8.5 (L) 10/01/2021   GFRNONAA 51 (L) 10/01/2021   Lab Results  Component Value Date   WBC 20.4 (H) 10/01/2021   HGB 14.5 10/01/2021   HCT 45.4 10/01/2021   MCV 100.9 (H) 10/01/2021   PLT 175 10/01/2021   CXR: No discrete nodule on  single lateral view. The possibility of underlying nodule can not be completely excluded. Short-term follow-up CT chest is recommended for further characterization.  XR Hip: No acute abnormality noted.  Assessment and Plan: Drug overdose     - placed in obs, progressive     - UDS pending     - PRN narcan     - only admits to marijuana use; doesn't admit to anything else during our interview  Acute respiratory failure with hypoxia SIRS     - wean O2 as able     - CXR negative for infiltrate     - check CT chest     - continue current abx (cefepime, flagyl: asp coverage w/ PCN allergy)     - check procal, lactic acid, UA     - follow Bld Cx  AKI     - check renal US     - watch nephrotoxins     - fluids  Polysubstance abuse     - tobacco, alcohol, illicit Rx     - counseled against further use     - nicotine patch     - COWS; he's hypotensive, so will hold clonidine, but can institute the other portions of the detox protocol  Advance Care Planning:   Code Status: FULL  Consults: None  Family Communication: None at bedside  Severity of Illness: The appropriate patient status for this patient is OBSERVATION. Observation status is judged to be reasonable and necessary in order to provide the required intensity of service to ensure the patient's safety. The patient's presenting symptoms, physical exam findings, and initial radiographic and laboratory data in the context of their medical condition is felt to place them at decreased risk for further clinical deterioration. Furthermore, it is anticipated that the patient will be medically stable for discharge from the hospital within 2 midnights of admission.   Author: Teddy Spike, DO 10/01/2021 7:27 AM  For on call review [email or web address].

## 2021-10-01 NOTE — Progress Notes (Signed)
Pharmacy Antibiotic Note  Russell Chan. is a 36 y.o. male admitted on 10/01/2021 with possible aspiration pneumonia 2/2 drug overdose. Pharmacy has been consulted for cefepime dosing.  Plan: Cefepime 2g IV q12 hours for CrCl 30-60 mL/min Flagyl 500mg  IV q12 hours per MD F/u antibiotic length of therapy and ability to narrow antibiotics  Height: 5\' 7"  (170.2 cm) Weight: 58 kg (127 lb 13.9 oz) IBW/kg (Calculated) : 66.1  Temp (24hrs), Avg:97.8 F (36.6 C), Min:97.6 F (36.4 C), Max:97.9 F (36.6 C)  Recent Labs  Lab 10/01/21 0225  WBC 20.4*  CREATININE 1.76*    Estimated Creatinine Clearance: 47.6 mL/min (A) (by C-G formula based on SCr of 1.76 mg/dL (H)).    Allergies  Allergen Reactions   Bee Venom Anaphylaxis   Nutritional Supplements Anaphylaxis   Onion Anaphylaxis   Penicillins Swelling and Rash    Antimicrobials this admission: Cefepime 8/16 >>  Flagyl 8/16 >>   Dose adjustments this admission:  Microbiology results: 8/16 BCx: in process 8/16 Strep pneumo: 8/16 Legionella:   Thank you for allowing pharmacy to be a part of this patient's care.  9/16, PharmD, BCPS 10/01/2021 9:15 AM

## 2021-10-01 NOTE — Progress Notes (Signed)
Pt given 6L O2 on salter. O2 at 92%

## 2021-10-01 NOTE — ED Provider Notes (Signed)
[facility]-EMERGENCY DEPT Provider Note   CSN: [id number] Arrival date & time: 10/01/21  0127     History  Chief Complaint  Patient presents with   Drug Overdose    Russell Chan. is a 36 y.o. male.  HPI   Patient with medical history including polysubstance dependency presents to the emergency room with complaints of an OD.  Patient states around 11:30 PM yesterday he took an unknown amount of Xanax, fentanyl and heroin, he states that he woke up with people around him, states that he is having some right hip pain but has no other complaints.  He states that this was recreational he was not trying to hurt himself, he denies any suicidal homicidal ideations denies any hallucinations or delusions.  EMS was called out by family who found him unresponsive on the floor, EMS gave him 4 mg of Narcan, and his O2 sats remained in the low 90s unclear when Narcan was given.  Home Medications Prior to Admission medications   Medication Sig Start Date End Date Taking? Authorizing Provider  docusate sodium (COLACE) 100 MG capsule Take 1 capsule (100 mg total) by mouth every 12 (twelve) hours. Patient not taking: Reported on 11/06/2019 07/22/16   Ward, Layla Maw, DO  HYDROcodone-acetaminophen (NORCO/VICODIN) 5-325 MG tablet Take 2 tablets by mouth every 4 (four) hours as needed. Patient not taking: Reported on 11/06/2019 10/05/15   Nelva Nay, MD  polyethylene glycol [facility]) packet Take 17 g by mouth daily. Patient not taking: Reported on 11/06/2019 07/22/16   Ward, Layla Maw, DO      Allergies    Bee venom, Nutritional supplements, Onion, and Penicillins    Review of Systems   Review of Systems  Constitutional:  Negative for chills and fever.  Respiratory:  Negative for shortness of breath.   Cardiovascular:  Negative for chest pain.  Gastrointestinal:  Negative for abdominal pain.  Musculoskeletal:        Right hip pain  Neurological:  Negative for headaches.     Physical Exam Updated Vital Signs BP 97/81   Pulse 95   Temp 97.9 F (36.6 C) (Oral)   Resp 13   Ht 5\' 7"  (1.702 m)   Wt 58 kg   SpO2 96%   BMI 20.03 kg/m  Physical Exam Vitals and nursing note reviewed.  Constitutional:      General: He is not in acute distress.    Appearance: He is not ill-appearing.     Comments: Disheveled, unkept  HENT:     Head: Normocephalic and atraumatic.     Comments: No deformity  of the head present no raccoon eyes or battle sign noted.    Nose: No congestion.     Mouth/Throat:     Mouth: Mucous membranes are moist.     Pharynx: Oropharynx is clear.     Comments: No trismus no torticollis no oral trauma present. Eyes:     Extraocular Movements: Extraocular movements intact.     Conjunctiva/sclera: Conjunctivae normal.     Pupils: Pupils are equal, round, and reactive to light.  Neck:     Comments: No JVD Cardiovascular:     Rate and Rhythm: Regular rhythm. Tachycardia present.     Pulses: Normal pulses.     Heart sounds: No murmur heard.    No friction rub. No gallop.  Pulmonary:     Effort: No respiratory distress.     Breath sounds: No wheezing, rhonchi or rales.  Comments: Patient was slightly tachypneic on my exam but able to speak in full sentences, no accessory muscle usage, while on room air he was in the low 90s then dropping to 88, placed on 2 L remained in the mid 90s.  Lung sounds are clear bilaterally. Abdominal:     Palpations: Abdomen is soft.     Tenderness: There is no abdominal tenderness. There is no right CVA tenderness or left CVA tenderness.  Musculoskeletal:     Comments: Spine was palpated was nontender to palpation no step-off deformities noted, no pelvis instability no leg shortening, he had point tenderness on his right trochanter.  There is no overlying skin changes seen on patient's back or hips.  No peripheral edema  Skin:    General: Skin is warm and dry.     Comments: Ecchymosis noted on his legs.   Neurological:     Mental Status: He is alert.     Comments: No facial asymmetry no difficulty with word finding following two-step commands no unilateral weakness present.  Psychiatric:        Mood and Affect: Mood normal.     Comments: Responding appropriately, does not appear to be responding internal stimuli, denies suicidal homicidal ideations.     ED Results / Procedures / Treatments   Labs (all labs ordered are listed, but only abnormal results are displayed) Labs Reviewed  CBC WITH DIFFERENTIAL/PLATELET - Abnormal; Notable for the following components:      Result Value   WBC 20.4 (*)    MCV 100.9 (*)    Neutro Abs 17.7 (*)    Monocytes Absolute 1.4 (*)    All other components within normal limits  BASIC METABOLIC PANEL - Abnormal; Notable for the following components:   CO2 21 (*)    Creatinine, Ser 1.76 (*)    Calcium 8.5 (*)    GFR, Estimated 51 (*)    All other components within normal limits  BLOOD GAS, VENOUS - Abnormal; Notable for the following components:   pO2, Ven 62 (*)    Acid-base deficit 4.0 (*)    All other components within normal limits  CULTURE, BLOOD (ROUTINE X 2)  CULTURE, BLOOD (ROUTINE X 2)  ETHANOL  RAPID URINE DRUG SCREEN, HOSP PERFORMED    EKG None  Radiology DG Chest 1 View  Result Date: 10/01/2021 CLINICAL DATA:  Lateral view for further evaluation of nodule. EXAM: CHEST  1 VIEW COMPARISON:  10/01/2021. FINDINGS: The heart size and mediastinal contours are stable. No consolidation, effusion, or pneumothorax. No discrete nodule is seen on the lateral view. No acute osseous abnormality. IMPRESSION: No discrete nodule on single lateral view. The possibility of underlying nodule can not be completely excluded. Short-term follow-up CT chest is recommended for further characterization. Electronically Signed   By: Thornell Sartorius M.D.   On: 10/01/2021 02:59   DG Hip Unilat W or Wo Pelvis 2-3 Views Right  Result Date: 10/01/2021 CLINICAL DATA:   Right hip pain, no known injury, initial encounter EXAM: DG HIP (WITH OR WITHOUT PELVIS) 3V RIGHT COMPARISON:  None Available. FINDINGS: Pelvic ring is intact. No acute fracture or dislocation is noted. No soft tissue changes are seen. IMPRESSION: No acute abnormality noted. Electronically Signed   By: Alcide Clever M.D.   On: 10/01/2021 02:25   DG Chest Portable 1 View  Result Date: 10/01/2021 CLINICAL DATA:  Shortness of breath, recent overdose EXAM: PORTABLE CHEST 1 VIEW COMPARISON:  04/03/2017 FINDINGS: Cardiac shadow is within  normal limits. The lungs are well aerated bilaterally. Mild increased perihilar density is noted on the left which may represent some early infiltrate. This may also be accentuated due to patient rotation to the right. No bony abnormality is noted. IMPRESSION: Prominence in the left suprahilar region which may be related to patient rotation. Two view of the chest is recommended when clinically able. Electronically Signed   By: Alcide Clever M.D.   On: 10/01/2021 02:10    Procedures .Critical Care  Performed by: Carroll Sage, PA-C Authorized by: Carroll Sage, PA-C   Critical care provider statement:    Critical care time (minutes):  60   Critical care time was exclusive of:  Separately billable procedures and treating other patients   Critical care was time spent personally by me on the following activities:  Discussions with consultants, evaluation of patient's response to treatment, examination of patient, ordering and review of laboratory studies, ordering and review of radiographic studies, ordering and performing treatments and interventions, pulse oximetry, re-evaluation of patient's condition and review of old charts   I assumed direction of critical care for this patient from another provider in my specialty: no     Care discussed with: admitting provider       Medications Ordered in ED Medications  ceFEPIme (MAXIPIME) 2 g in sodium chloride 0.9 %  100 mL IVPB (2 g Intravenous New Bag/Given 10/01/21 0450)  metroNIDAZOLE (FLAGYL) IVPB 500 mg (has no administration in time range)  sodium chloride 0.9 % bolus 1,000 mL (0 mLs Intravenous Stopped 10/01/21 0306)  naloxone [facility]) injection 0.4 mg (0.4 mg Intravenous Given 10/01/21 0321)  ondansetron (ZOFRAN) injection 4 mg (4 mg Intravenous Given 10/01/21 0320)  sodium chloride 0.9 % bolus 1,000 mL (0 mLs Intravenous Stopped 10/01/21 0452)    ED Course/ Medical Decision Making/ A&P                           Medical Decision Making Amount and/or Complexity of Data Reviewed Labs: ordered. Radiology: ordered.  Risk Prescription drug management. Decision regarding hospitalization.   This patient presents to the ED for concern of overdose, this involves an extensive number of treatment options, and is a complaint that carries with it a high risk of complications and morbidity.  The differential diagnosis includes psychiatric emergency, metabolic derailments, withdrawals    Additional history obtained:  Additional history obtained from EMS External records from outside source obtained and reviewed including previous discharge summaries   Co morbidities that complicate the patient evaluation  Poly substance dependency  Social Determinants of Health:  N/A    Lab Tests:  I Ordered, and personally interpreted labs.  The pertinent results include: CBC shows leukocytosis of 20.4, BMP shows CO2 of 21, creatinine 1. 76, GFR 51 ethanol less than 10   Imaging Studies ordered:  I ordered imaging studies including chest x-ray, DG of right hip I independently visualized and interpreted imaging which showed imaging negative acute findings I agree with the radiologist interpretation   Cardiac Monitoring:  The patient was maintained on a cardiac monitor.  I personally viewed and interpreted the cardiac monitored which showed an underlying rhythm of: Without signs of  ischemia   Medicines ordered and prescription drug management:  I ordered medication including fluids, Narcan I have reviewed the patients home medicines and have made adjustments as needed  Critical Interventions:  Hypoxia placed on supplemental oxygen.   Reevaluation:  Present after OD,  my initial evaluation he was hypoxic, placed on 2 L.  Will obtain lab work and reassess  Reassessed O2 sats dropped again increased to 4 L oxygen saturations in the mid 95, nontachypneic, resting comfortably.  Patient was reassessed, O2 sats have dropped low 90s,  he is also slightly hypotensive, will provide him with dose of Narcan and continue to monitor.  Reassessed the patient, Narcan did not significantly improving blood pressure or O2 status, he was endorsing dry nose, will he humidify the air, and give him additional liter of fluids.  Reassessment after additional fluids, BP is 95/50, respiratory sats are in the mid to low 90s, will start him on antibiotics for coverage of possible aspirate pneumonia and admit patient.   Consultations Obtained:  I requested consultation with the hospitalist Dr. Lennie Odor,  and discussed lab and imaging findings as well as pertinent plan - they recommend:  will admit the patient.    Test Considered:  CTA of chest-deferred my suspicion for PE is very low at this time, more consistent with likely overdose.    Rule out My suspicion for sepsis is low at this time, as presentation is atypical of etiology, he is having no URI-like symptoms no nausea vomiting stomach pain, there is no evidence infection present.  noted that he has leukocytosis as well as being tachycardic with soft BP but I suspect this is all secondary due to polysubstance use.  I have low suspicion for pneumonia as he denies any cough or congestion, lung sounds are clear bilaterally, 2 view chest x-ray is negative for signs of consolidation.  Low suspicion for intracranial head bleed or CVA  no head trauma, not anticoagulated, no focal deficit on my exam, not endorsing headache change in vision paresthesias or weakness to upper or lower extremities.  Low suspicion for withdrawals as he is nontremulous on my exam vital signs are inconsistent with etiology.  Low suspicion for PE presentation is atypical, he denies any chest pain or shortness of breath, no leg swelling, no recent surgeries or long immobilization, he states that this happened directly after he overdose.  I also doubt cardiogenic shock no evidence of peripheral edema no Rales heard my exam, presentation appears to be atypical etiology.    Dispostion and problem list  After consideration of the diagnostic results and the patients response to treatment, I feel that the patent would benefit from admission.  Hypoxia-suspect this is secondary due to polysubstance use, will defer on providing with additional Narcan as not giving him any clinical benefit at this time, I have a concern for possible aspiration pneumonia,  blood cultures been obtained, will start him on antibiotics.  He will need further observation. AKI-likely secondary due to dehydration, states he has been drinking soda, patient will need further monitoring.            Final Clinical Impression(s) / ED Diagnoses Final diagnoses:  Accidental overdose, initial encounter  AKI (acute kidney injury) [facility])    Rx / DC Orders ED Discharge Orders     None         Carroll Sage, PA-C 10/01/21 0454    Shon Baton, MD 10/01/21 [phone]

## 2021-10-01 NOTE — ED Triage Notes (Signed)
Patient BIB [facility] c/o drug OD.  Patient admits to using heroin and xanax tonight.  Patient reports using at approx 2130.  Family reports finding patient unresponsive and calling 911.  Unknown downtime.  EMS gave a total of 4 mg of narcan.  Patient saturated in sweat upon arrival and cold to touch.  Patient alert and oriented, endorses right hip pain.

## 2021-10-02 ENCOUNTER — Other Ambulatory Visit: Payer: Self-pay

## 2021-10-02 ENCOUNTER — Other Ambulatory Visit (HOSPITAL_COMMUNITY): Payer: Self-pay

## 2021-10-02 DIAGNOSIS — J690 Pneumonitis due to inhalation of food and vomit: Secondary | ICD-10-CM | POA: Diagnosis present

## 2021-10-02 LAB — COMPREHENSIVE METABOLIC PANEL
ALT: 136 U/L — ABNORMAL HIGH (ref 0–44)
AST: 239 U/L — ABNORMAL HIGH (ref 15–41)
Albumin: 3.3 g/dL — ABNORMAL LOW (ref 3.5–5.0)
Alkaline Phosphatase: 44 U/L (ref 38–126)
Anion gap: 4 — ABNORMAL LOW (ref 5–15)
BUN: 14 mg/dL (ref 6–20)
CO2: 27 mmol/L (ref 22–32)
Calcium: 8.1 mg/dL — ABNORMAL LOW (ref 8.9–10.3)
Chloride: 110 mmol/L (ref 98–111)
Creatinine, Ser: 0.81 mg/dL (ref 0.61–1.24)
GFR, Estimated: >60 mL/min (ref >60)
Glucose, Bld: 89 mg/dL (ref 70–99)
Potassium: 3.8 mmol/L (ref 3.5–5.1)
Sodium: 141 mmol/L (ref 135–145)
Total Bilirubin: 0.4 mg/dL (ref 0.3–1.2)
Total Protein: 5.7 g/dL — ABNORMAL LOW (ref 6.5–8.1)

## 2021-10-02 LAB — LEGIONELLA PNEUMOPHILA SEROGP 1 UR AG: L. pneumophila Serogp 1 Ur Ag: NEGATIVE

## 2021-10-02 LAB — CBC
HCT: 38 % — ABNORMAL LOW (ref 39.0–52.0)
Hemoglobin: 12.6 g/dL — ABNORMAL LOW (ref 13.0–17.0)
MCH: 31.7 pg (ref 26.0–34.0)
MCHC: 33.2 g/dL (ref 30.0–36.0)
MCV: 95.7 fL (ref 80.0–100.0)
Platelets: 174 10*3/uL (ref 150–400)
RBC: 3.97 MIL/uL — ABNORMAL LOW (ref 4.22–5.81)
RDW: 13.8 % (ref 11.5–15.5)
WBC: 14.3 10*3/uL — ABNORMAL HIGH (ref 4.0–10.5)
nRBC: 0 % (ref 0.0–0.2)

## 2021-10-02 MED ORDER — LEVOFLOXACIN 750 MG PO TABS
750.0000 mg | ORAL_TABLET | Freq: Every day | ORAL | 0 refills | Status: AC
  Filled 2021-10-02: qty 7, 7d supply, fill #0

## 2021-10-02 NOTE — Plan of Care (Signed)
PIVs removed. DC paperwork reviewed   Problem: Activity: Goal: Ability to tolerate increased activity will improve Outcome: Completed/Met   Problem: Clinical Measurements: Goal: Ability to maintain a body temperature in the normal range will improve Outcome: Completed/Met   Problem: Respiratory: Goal: Ability to maintain adequate ventilation will improve Outcome: Completed/Met Goal: Ability to maintain a clear airway will improve Outcome: Completed/Met   Problem: Education: Goal: Knowledge of General Education information will improve Description: Including pain rating scale, medication(s)/side effects and non-pharmacologic comfort measures Outcome: Completed/Met   Problem: Health Behavior/Discharge Planning: Goal: Ability to manage health-related needs will improve Outcome: Completed/Met   Problem: Clinical Measurements: Goal: Ability to maintain clinical measurements within normal limits will improve Outcome: Completed/Met Goal: Will remain free from infection Outcome: Completed/Met Goal: Diagnostic test results will improve Outcome: Completed/Met Goal: Respiratory complications will improve Outcome: Completed/Met Goal: Cardiovascular complication will be avoided Outcome: Completed/Met   Problem: Activity: Goal: Risk for activity intolerance will decrease Outcome: Completed/Met   Problem: Nutrition: Goal: Adequate nutrition will be maintained Outcome: Completed/Met   Problem: Coping: Goal: Level of anxiety will decrease Outcome: Completed/Met   Problem: Elimination: Goal: Will not experience complications related to bowel motility Outcome: Completed/Met Goal: Will not experience complications related to urinary retention Outcome: Completed/Met   Problem: Pain Managment: Goal: General experience of comfort will improve Outcome: Completed/Met   Problem: Safety: Goal: Ability to remain free from injury will improve Outcome: Completed/Met   Problem: Skin  Integrity: Goal: Risk for impaired skin integrity will decrease Outcome: Completed/Met

## 2021-10-02 NOTE — Discharge Summary (Signed)
Physician Discharge Summary  Manuella Ghazi. PXT:[id number] DOB: [birth year] DOA: 10/01/2021  PCP: Patient, No Pcp Per  Admit date: 10/01/2021 Discharge date: 10/02/2021  Admitted From: Home Disposition: Home  Recommendations for Outpatient Follow-up:  Follow up with PCP in 1-2 weeks Discharged on Levaquin 750 mg p.o. daily to complete 7-day course for aspiration pneumonia Continue to discuss and encourage illicit substance cessation  Home Health: No Equipment/Devices: None   Discharge Condition: Stable CODE STATUS: Full code Diet recommendation: Regular diet  History of present illness:  Suzanne Garbers. is a 36 year old male with past medical history significant for polysubstance abuse who presented to [facility] ED on 8/16 via EMS after family found patient unresponsive.  Unknown downtime.  Patient apparently admitted to using heroin, fentanyl and Xanax.  EMS on arrival gave a total of 4 mg of Narcan and patient was transported to the ED for further evaluation.  Upon arrival to the ED, patient unable to give much of a history as he states does not remember anything before waking up this morning.  Patient denied any suicidal intentions.  In the ED, temperature 97.9 F, HR 107, RR 24, BP 106/80, SPO2 88% on 2 L nasal cannula.  Sodium 143, potassium 3.7, chloride 109, CO2 21, glucose 96, BUN 18, creatinine 1.76.  WBC 20.4, hemoglobin 14.5, platelet count 175.  Urinalysis unrevealing.  UDS positive for amphetamines, cocaine, THC.  Chest x-ray with prominence in the left suprahilar region may be related to rotation.  Right hip/pelvis with no acute abnormality.  Patient was given additional dose of Narcan, 2 L NS bolus and started on cefepime and metronidazole for concern of aspiration pneumonia given elevated white count and oxygen need.  Given patient's continued somnolence, EDP requested admission for further evaluation and management of unintentional overdose, AKI, aspiration  pneumonia.  Hospital course:  Acute hypoxic respite failure, POA Aspiration pneumonia Patient presenting to the ED after being found down by family for unknown amount of time.  Apparently patient has been doing fentanyl, Xanax, and heroin.  On arrival, patient requiring supplemental oxygen.  CT chest without contrast notable for multifocal pneumonia with left lung groundglass opacities, superior segment left lower lobe concerning for aspiration event.  Patient was afebrile but leukocytosis up to 20.4 on admission.  Patient was initially started on IV cefepime and Flagyl.  Patient responded well and was able to be titrated off of supplemental oxygen.  Will discontinue IV antibiotics and transition to Levaquin 750 mg p.o. daily to complete 7-day course.  Discussed substance abuse cessation as likely etiology to his presenting symptoms.  Acute renal failure: Resolved Upon presentation, creatinine noted to be elevated 1.76.  Etiology likely secondary to prerenal azotemia in the setting of dehydration from unknown downtime from overdose. Renal ultrasound with normal sonographic appearance of the kidneys with trace hepatorenal ascites.  Patient was given IV fluid hydration with improvement of creatinine to 0.81 at time of discharge.  Unintentional overdose Polysubstance abuse Presenting to ED after being found down by family for unknown amount of time.  Apparently patient has been doing fentanyl, Xanax and heroin.  UDS positive for THC, amphetamines and cocaine.  Patient received Narcan via EMS followed by a repeat dose in the emergency department.  Discussed with patient needs for complete cessation from his illicit substances as likely leading etiology to his hospitalization and aspiration pneumonia.  Discharge Diagnoses:  Principal Problem:   Acute respiratory failure with hypoxia (HCC) Active Problems:   Drug overdose  AKI (acute kidney injury) (HCC)   Polysubstance abuse (HCC)   Aspiration  pneumonia [facility](HCC)    Discharge Instructions  Discharge Instructions     Call MD for:  difficulty breathing, headache or visual disturbances   Complete by: As directed    Call MD for:  extreme fatigue   Complete by: As directed    Call MD for:  persistant dizziness or light-headedness   Complete by: As directed    Call MD for:  persistant nausea and vomiting   Complete by: As directed    Call MD for:  severe uncontrolled pain   Complete by: As directed    Call MD for:  temperature >100.4   Complete by: As directed    Diet - low sodium heart healthy   Complete by: As directed    Increase activity slowly   Complete by: As directed       Allergies as of 10/02/2021       Reactions   Bee Venom Anaphylaxis   Nutritional Supplements Anaphylaxis   Onion Anaphylaxis   Penicillins Swelling, Rash        Medication List     STOP taking these medications    docusate sodium 100 MG capsule Commonly known as: COLACE   HYDROcodone-acetaminophen 5-325 MG tablet Commonly known as: NORCO/VICODIN   polyethylene glycol 17 g packet Commonly known as: MiraLax       TAKE these medications    levofloxacin 750 MG tablet Commonly known as: Levaquin Take 1 tablet (750 mg total) by mouth daily for 7 days.        Allergies  Allergen Reactions   Bee Venom Anaphylaxis   Nutritional Supplements Anaphylaxis   Onion Anaphylaxis   Penicillins Swelling and Rash    Consultations: None    Procedures/Studies: CT CHEST WO CONTRAST  Result Date: 10/01/2021 CLINICAL DATA:  36 year old male with respiratory illness. EXAM: CT CHEST WITHOUT CONTRAST TECHNIQUE: Multidetector CT imaging of the chest was performed following the standard protocol without IV contrast. RADIATION DOSE REDUCTION: This exam was performed according to the departmental dose-optimization program which includes automated exposure control, adjustment of the mA and/or kV according to patient size and/or use of  iterative reconstruction technique. COMPARISON:  Chest radiograph from 10/01/2021 FINDINGS: Cardiovascular: No significant vascular findings. Normal heart size. No pericardial effusion. Mediastinum/Nodes: No enlarged mediastinal or axillary lymph nodes. Thyroid gland, trachea, and esophagus demonstrate no significant findings. Lungs/Pleura: Ground-glass and tree-in-bud opacification predominantly in the superior segment of the left upper lobe in a peribronchovascular distribution with several scattered associated tiny cavitary changes. Faint posterior left upper lobe scattered ground-glass opacities. Bibasilar subsegmental atelectasis. No evidence of pleural effusion or pneumothorax. Upper Abdomen: No acute abnormality. Musculoskeletal: No chest wall mass or suspicious bone lesions identified. IMPRESSION: 1. Left lung ground-glass and consolidative opacities most concentrated in the superior segment of the left lower lobe, with a few associated tiny cavitary changes. These findings are most compatible with multifocal pneumonia, possibly of aspiration etiology. 2. Bibasal subsegmental atelectasis. Electronically Signed   By: Marliss Cootsylan  Suttle M.D.   On: 10/01/2021 10:47   US RENAL  Result Date: 10/01/2021 CLINICAL DATA:  36 year old male with acute kidney injury EXAM: RENAL / URINARY TRACT ULTRASOUND COMPLETE COMPARISON:  None Available. FINDINGS: Right Kidney: Renal measurements: 10.6 x 4.3 x 6.2 cm = volume: 147 mL. Echogenicity within normal limits. No mass or hydronephrosis visualized. Left Kidney: Renal measurements: 10.7 x 5.6 x 5.3 cm = volume: 164 mL. Echogenicity within normal  limits. No mass or hydronephrosis visualized. Bladder: Appears normal for degree of bladder distention, mostly decompressed. Other: Trace free fluid in the hepatic renal recess. IMPRESSION: 1. Normal sonographic appearance of the kidneys. 2. Trace hepatorenal ascites. Marliss Coots, MD [facility]  [facility] Electronically Signed   By: Marliss Coots M.D.   On: 10/01/2021 10:20   DG Chest 1 View  Result Date: 10/01/2021 CLINICAL DATA:  Lateral view for further evaluation of nodule. EXAM: CHEST  1 VIEW COMPARISON:  10/01/2021. FINDINGS: The heart size and mediastinal contours are stable. No consolidation, effusion, or pneumothorax. No discrete nodule is seen on the lateral view. No acute osseous abnormality. IMPRESSION: No discrete nodule on single lateral view. The possibility of underlying nodule can not be completely excluded. Short-term follow-up CT chest is recommended for further characterization. Electronically Signed   By: Thornell Sartorius M.D.   On: 10/01/2021 02:59   DG Hip Unilat W or Wo Pelvis 2-3 Views Right  Result Date: 10/01/2021 CLINICAL DATA:  Right hip pain, no known injury, initial encounter EXAM: DG HIP (WITH OR WITHOUT PELVIS) 3V RIGHT COMPARISON:  None Available. FINDINGS: Pelvic ring is intact. No acute fracture or dislocation is noted. No soft tissue changes are seen. IMPRESSION: No acute abnormality noted. Electronically Signed   By: Alcide Clever M.D.   On: 10/01/2021 02:25   DG Chest Portable 1 View  Result Date: 10/01/2021 CLINICAL DATA:  Shortness of breath, recent overdose EXAM: PORTABLE CHEST 1 VIEW COMPARISON:  04/03/2017 FINDINGS: Cardiac shadow is within normal limits. The lungs are well aerated bilaterally. Mild increased perihilar density is noted on the left which may represent some early infiltrate. This may also be accentuated due to patient rotation to the right. No bony abnormality is noted. IMPRESSION: Prominence in the left suprahilar region which may be related to patient rotation. Two view of the chest is recommended when clinically able. Electronically Signed   By: Alcide Clever M.D.   On: 10/01/2021 02:10     Subjective: Patient seen examined bedside, resting company.  No specific complaints this morning.  Has been titrated off of supplemental  oxygen and ready for discharge home.  Discussed need for complete cessation regarding illicit substances as this was likely etiology to his hospitalization.  Discussed will discharge on antibiotics for likely aspiration event leading to pneumonia.  No other specific questions or concerns at this time.  Denies headache, no dizziness, no visual changes, no chest pain, no palpitations, no shortness of breath, no abdominal pain, no focal weakness, no cough/congestion, no nausea/vomiting/diarrhea, no fever/chills/night sweats, no paresthesias.  No acute events overnight per nursing staff.  Discharge Exam: Vitals:   10/02/21 0205 10/02/21 0415  BP: 115/88 119/70  Pulse: 82 82  Resp: 18 18  Temp: 97.9 F (36.6 C) 98.1 F (36.7 C)  SpO2: 97% 97%   Vitals:   10/01/21 1747 10/01/21 2031 10/02/21 0205 10/02/21 0415  BP: 101/73 112/76 115/88 119/70  Pulse: 74 80 82 82  Resp: 19 20 18 18   Temp: 98.1 F (36.7 C) 98.2 F (36.8 C) 97.9 F (36.6 C) 98.1 F (36.7 C)  TempSrc: Oral Oral Oral Oral  SpO2: 99% 100% 97% 97%  Weight:      Height:        Physical Exam: GEN: NAD, alert, appears older than stated age HEENT: NCAT, PERRL, EOMI, sclera clear, MMM PULM: CTAB w/o wheezes/crackles, normal respiratory effort, on room air CV: RRR w/o M/G/R GI:  abd soft, NTND, NABS, no R/G/M MSK: no peripheral edema, muscle strength globally intact 5/5 bilateral upper/lower extremities NEURO: CN II-XII intact, no focal deficits, sensation to light touch intact PSYCH: normal mood/affect Integumentary: dry/intact, no rashes or wounds    The results of significant diagnostics from this hospitalization (including imaging, microbiology, ancillary and laboratory) are listed below for reference.     Microbiology: Recent Results (from the past 240 hour(s))  Blood culture (routine x 2)     Status: None (Preliminary result)   Collection Time: 10/01/21  3:40 AM   Specimen: BLOOD RIGHT HAND  Result Value Ref Range  Status   Specimen Description   Final    BLOOD RIGHT HAND Performed at [facility], [address], [place], [place] [address]    Special Requests   Final    BOTTLES DRAWN AEROBIC AND ANAEROBIC Blood Culture results may not be optimal due to an excessive volume of blood received in culture bottles Performed at [facility], [address] [place], [place] [address]    Culture   Final    NO GROWTH 1 DAY Performed at [facility], [address] [place], [place] [address]    Report Status PENDING  Incomplete  Blood culture (routine x 2)     Status: None (Preliminary result)   Collection Time: 10/01/21  3:17 PM   Specimen: BLOOD  Result Value Ref Range Status   Specimen Description   Final    BLOOD BLOOD LEFT FOREARM Performed at [facility], [address], [place], [place] [address]    Special Requests   Final    BOTTLES DRAWN AEROBIC ONLY Blood Culture adequate volume Performed at [facility], [address] [place], [place] [address]    Culture   Final    NO GROWTH < 24 HOURS Performed at [facility], [address], [place], [place] [address]    Report Status PENDING  Incomplete     Labs: BNP (last 3 results) No results for input(s): "BNP" in the last 8760 hours. Basic Metabolic Panel: Recent Labs  Lab 10/01/21 0225 10/02/21 0532  NA 143 141  K 3.7 3.8  CL 109 110  CO2 21* 27  GLUCOSE 96 89  BUN 18 14  CREATININE 1.76* 0.81  CALCIUM 8.5* 8.1*   Liver Function Tests: Recent Labs  Lab 10/02/21 0532  AST 239*  ALT 136*  ALKPHOS 44  BILITOT 0.4  PROT 5.7*  ALBUMIN 3.3*   No results for input(s): "LIPASE", "AMYLASE" in the last 168 hours. No results for input(s): "AMMONIA" in the last 168 hours. CBC: Recent Labs  Lab 10/01/21 0225 10/02/21 0532  WBC 20.4* 14.3*  NEUTROABS 17.7*  --   HGB 14.5 12.6*  HCT 45.4 38.0*  MCV 100.9* 95.7  PLT 175 174    Cardiac Enzymes: No results for input(s): "CKTOTAL", "CKMB", "CKMBINDEX", "TROPONINI" in the last 168 hours. BNP: Invalid input(s): "POCBNP" CBG: No results for input(s): "GLUCAP" in the last 168 hours. D-Dimer No results for input(s): "DDIMER" in the last 72 hours. Hgb A1c No results for input(s): "HGBA1C" in the last 72 hours. Lipid Profile No results for input(s): "CHOL", "HDL", "LDLCALC", "TRIG", "CHOLHDL", "LDLDIRECT" in the last 72 hours. Thyroid function studies No results for input(s): "TSH", "T4TOTAL", "T3FREE", "THYROIDAB" in the last 72 hours.  Invalid input(s): "FREET3" Anemia work up No results for input(s): "VITAMINB12", "FOLATE", "FERRITIN", "TIBC", "IRON", "RETICCTPCT" in the  last 72 hours. Urinalysis    Component Value Date/Time   COLORURINE YELLOW 10/01/2021 1006   APPEARANCEUR CLEAR 10/01/2021 1006   LABSPEC 1.020 10/01/2021 1006   PHURINE 5.0 10/01/2021 1006   GLUCOSEU NEGATIVE 10/01/2021 1006   HGBUR LARGE (A) 10/01/2021 1006   BILIRUBINUR NEGATIVE 10/01/2021 1006   KETONESUR NEGATIVE 10/01/2021 1006   PROTEINUR 30 (A) 10/01/2021 1006   UROBILINOGEN 1.0 09/22/2009 1203   NITRITE NEGATIVE 10/01/2021 1006   LEUKOCYTESUR NEGATIVE 10/01/2021 1006   Sepsis Labs Recent Labs  Lab 10/01/21 0225 10/02/21 0532  WBC 20.4* 14.3*   Microbiology Recent Results (from the past 240 hour(s))  Blood culture (routine x 2)     Status: None (Preliminary result)   Collection Time: 10/01/21  3:40 AM   Specimen: BLOOD RIGHT HAND  Result Value Ref Range Status   Specimen Description   Final    BLOOD RIGHT HAND Performed at [facility], [address] [place], [place] [address]    Special Requests   Final    BOTTLES DRAWN AEROBIC AND ANAEROBIC Blood Culture results may not be optimal due to an excessive volume of blood received in culture bottles Performed at [facility], [address], [place], [place] [address]     Culture   Final    NO GROWTH 1 DAY Performed at [facility], [address], [place], [place] [address]    Report Status PENDING  Incomplete  Blood culture (routine x 2)     Status: None (Preliminary result)   Collection Time: 10/01/21  3:17 PM   Specimen: BLOOD  Result Value Ref Range Status   Specimen Description   Final    BLOOD BLOOD LEFT FOREARM Performed at [facility], [address], [place], [place] [address]    Special Requests   Final    BOTTLES DRAWN AEROBIC ONLY Blood Culture adequate volume Performed at [facility], [address] [place], [place] [address]    Culture   Final    NO GROWTH < 24 HOURS Performed at [facility], [address], [place], [place] [address]    Report Status PENDING  Incomplete     Time coordinating discharge: Over 30 minutes  SIGNED:   Alvira Philips [place], DO  [facility] 10/02/2021, 9:59 AM

## 2021-10-02 NOTE — TOC Transition Note (Signed)
Transition of Care [facility]) - CM/SW Discharge Note   Patient Details  Name: Russell Chan. MRN: [id number] Date of Birth: 1985/10/10  [facility] CM/SW Contact:  Lanier Clam, RN Phone Number: 10/02/2021, 10:44 AM   Clinical Narrative:d/c home. Referral med asst. Spoke to patient about [facility] otpt pharmacy to deliver meds to bed for levaquin$9.37(they will bill patient) patient agree to cost-can afford. Has own transport. [facility] for pcp-patient will make own appt. No further CM needs.       Final next level of care: Home/Self Care Barriers to Discharge: No Barriers Identified   Patient Goals and CMS Choice Patient states their goals for this hospitalization and ongoing recovery are:: Home CMS Medicare.gov Compare Post Acute Care list provided to:: Patient Choice offered to / list presented to : Patient  Discharge Placement                       Discharge Plan and Services   Discharge Planning Services: CM Consult Post Acute Care Choice: NA                               Social Determinants of Health (SDOH) Interventions     Readmission Risk Interventions     No data to display

## 2021-10-03 ENCOUNTER — Other Ambulatory Visit (HOSPITAL_COMMUNITY): Payer: Self-pay

## 2021-10-06 ENCOUNTER — Other Ambulatory Visit (HOSPITAL_COMMUNITY): Payer: Self-pay

## 2021-10-06 LAB — CULTURE, BLOOD (ROUTINE X 2)
Culture: NO GROWTH
Culture: NO GROWTH
Special Requests: ADEQUATE

## 2021-10-10 ENCOUNTER — Other Ambulatory Visit (HOSPITAL_COMMUNITY): Payer: Self-pay

## 2021-10-17 DEATH — deceased
